# Patient Record
Sex: Male | Born: 1965 | Race: White | Hispanic: Yes | Marital: Married | Smoking: Light tobacco smoker
Health system: Southern US, Community
[De-identification: ages and names within clinical notes are randomized; demographics above are authoritative.]

## PROBLEM LIST (undated history)

## (undated) ENCOUNTER — Emergency Department: Discharge status: Absent without leave | Payer: Managed Care, Other (non HMO)

## (undated) DIAGNOSIS — K219 Gastro-esophageal reflux disease without esophagitis: Secondary | ICD-10-CM

## (undated) DIAGNOSIS — N529 Male erectile dysfunction, unspecified: Secondary | ICD-10-CM

## (undated) DIAGNOSIS — Z9889 Other specified postprocedural states: Secondary | ICD-10-CM

## (undated) HISTORY — PX: BUNIONECTOMY: SHX129

## (undated) HISTORY — PX: COLONOSCOPY: SHX174

## (undated) HISTORY — DX: Other specified postprocedural states: Z98.890

## (undated) HISTORY — DX: Gastro-esophageal reflux disease without esophagitis: K21.9

## (undated) HISTORY — DX: Male erectile dysfunction, unspecified: N52.9

---

## 2003-07-19 ENCOUNTER — Emergency Department (HOSPITAL_COMMUNITY): Admission: AC | Admit: 2003-07-19 | Discharge: 2003-07-19 | Payer: Self-pay

## 2003-08-01 ENCOUNTER — Encounter: Admission: RE | Admit: 2003-08-01 | Discharge: 2003-08-01 | Payer: Self-pay | Admitting: Chiropractic Medicine

## 2005-04-27 DIAGNOSIS — Z9889 Other specified postprocedural states: Secondary | ICD-10-CM

## 2005-04-27 HISTORY — DX: Other specified postprocedural states: Z98.890

## 2007-02-01 ENCOUNTER — Ambulatory Visit: Payer: Self-pay | Admitting: Internal Medicine

## 2007-02-03 ENCOUNTER — Encounter (INDEPENDENT_AMBULATORY_CARE_PROVIDER_SITE_OTHER): Payer: Self-pay | Admitting: *Deleted

## 2007-02-03 LAB — CONVERTED CEMR LAB
Eosinophils Absolute: 0.1 10*3/uL (ref 0.0–0.6)
Eosinophils Relative: 2.2 % (ref 0.0–5.0)
GFR calc Af Amer: 108 mL/min
GFR calc non Af Amer: 89 mL/min
Glucose, Bld: 103 mg/dL — ABNORMAL HIGH (ref 70–99)
HCT: 46.6 % (ref 39.0–52.0)
Lymphocytes Relative: 33.2 % (ref 12.0–46.0)
MCV: 90.3 fL (ref 78.0–100.0)
Neutro Abs: 3.9 10*3/uL (ref 1.4–7.7)
Neutrophils Relative %: 56.2 % (ref 43.0–77.0)
Platelets: 200 10*3/uL (ref 150–400)
Potassium: 3.7 meq/L (ref 3.5–5.1)
Sodium: 140 meq/L (ref 135–145)
Triglycerides: 177 mg/dL — ABNORMAL HIGH (ref 0–149)
WBC: 6.8 10*3/uL (ref 4.5–10.5)

## 2007-08-15 ENCOUNTER — Ambulatory Visit: Payer: Self-pay | Admitting: Internal Medicine

## 2007-08-22 ENCOUNTER — Telehealth: Payer: Self-pay | Admitting: Internal Medicine

## 2007-08-24 ENCOUNTER — Telehealth (INDEPENDENT_AMBULATORY_CARE_PROVIDER_SITE_OTHER): Payer: Self-pay | Admitting: *Deleted

## 2007-08-29 ENCOUNTER — Ambulatory Visit: Payer: Self-pay | Admitting: Internal Medicine

## 2007-09-09 ENCOUNTER — Telehealth (INDEPENDENT_AMBULATORY_CARE_PROVIDER_SITE_OTHER): Payer: Self-pay | Admitting: *Deleted

## 2007-09-09 ENCOUNTER — Ambulatory Visit: Payer: Self-pay | Admitting: Internal Medicine

## 2007-09-12 ENCOUNTER — Encounter (INDEPENDENT_AMBULATORY_CARE_PROVIDER_SITE_OTHER): Payer: Self-pay | Admitting: *Deleted

## 2007-09-27 ENCOUNTER — Encounter: Payer: Self-pay | Admitting: Internal Medicine

## 2007-12-27 HISTORY — PX: INCISION AND DRAINAGE PERIRECTAL ABSCESS: SHX1804

## 2008-01-09 ENCOUNTER — Ambulatory Visit (HOSPITAL_BASED_OUTPATIENT_CLINIC_OR_DEPARTMENT_OTHER): Admission: RE | Admit: 2008-01-09 | Discharge: 2008-01-09 | Payer: Self-pay | Admitting: General Surgery

## 2008-05-11 ENCOUNTER — Ambulatory Visit: Payer: Self-pay | Admitting: Family Medicine

## 2008-05-11 DIAGNOSIS — R42 Dizziness and giddiness: Secondary | ICD-10-CM

## 2009-02-26 ENCOUNTER — Ambulatory Visit: Payer: Self-pay | Admitting: Internal Medicine

## 2009-03-12 ENCOUNTER — Ambulatory Visit: Payer: Self-pay | Admitting: Internal Medicine

## 2009-03-18 LAB — CONVERTED CEMR LAB
ALT: 32 units/L (ref 0–53)
AST: 24 units/L (ref 0–37)
Basophils Relative: 0.2 % (ref 0.0–3.0)
Chloride: 106 meq/L (ref 96–112)
Creatinine, Ser: 1 mg/dL (ref 0.4–1.5)
Eosinophils Absolute: 0.1 10*3/uL (ref 0.0–0.7)
Eosinophils Relative: 2 % (ref 0.0–5.0)
GFR calc non Af Amer: 87.57 mL/min (ref >60)
LDL Cholesterol: 83 mg/dL (ref 0–99)
Lymphocytes Relative: 33.9 % (ref 12.0–46.0)
Neutrophils Relative %: 55.9 % (ref 43.0–77.0)
Potassium: 3.8 meq/L (ref 3.5–5.1)
RBC: 5.13 M/uL (ref 4.22–5.81)
TSH: 0.92 microintl units/mL (ref 0.35–5.50)
VLDL: 32.8 mg/dL (ref 0.0–40.0)
WBC: 6.2 10*3/uL (ref 4.5–10.5)

## 2010-04-14 ENCOUNTER — Ambulatory Visit: Payer: Self-pay | Admitting: Internal Medicine

## 2010-05-29 NOTE — Assessment & Plan Note (Signed)
Summary: HEAD CONGESTION/RH.....   Vital Signs:  Patient profile:   45 year old male Weight:      185.4 pounds BMI:     30.73 Temp:     98.3 degrees F oral Pulse rate:   76 / minute Resp:     15 per minute BP sitting:   114 / 80  (left arm) Cuff size:   large  Vitals Entered By: Shonna Chock CMA (April 14, 2010 4:42 PM) CC: Head congestion x 2 weeks , URI symptoms   CC:  Head congestion x 2 weeks  and URI symptoms.  History of Present Illness:      This is a 45 year old man who presents with RTI  symptoms, onset 2 weeks ago as head congestion.  The patient  now reports purulent nasal discharge and productive cough, but denies nasal congestion and sore throat.  The patient denies fever, dyspnea, and wheezing.  The patient denies headache.  The patient denies the following risk factors for Strep sinusitis: unilateral facial pain, tooth pain, and tender adenopathy.  Rx: Tylenol & Alka Seltzer Cold.  Current Medications (verified): 1)  None  Allergies: 1)  ! Codeine Sulfate (Codeine Sulfate)  Physical Exam  General:  in no acute distress; alert,appropriate and cooperative throughout examination Ears:  External ear exam shows no significant lesions or deformities.  Otoscopic examination reveals clear canals, tympanic membranes are intact bilaterally without bulging, retraction, inflammation or discharge. Hearing is grossly normal bilaterally. Nose:  External nasal examination shows no deformity or inflammation. Nasal mucosa are pink and moist without lesions or exudates. Mouth:  Oral mucosa and oropharynx without lesions or exudates.  Teeth in good repair. Lungs:  Normal respiratory effort, chest expands symmetrically. Lungs are clear to auscultation, no crackles or wheezes. Cervical Nodes:  No lymphadenopathy noted Axillary Nodes:  No palpable lymphadenopathy; very ticklish   Impression & Recommendations:  Problem # 1:  SINUSITIS- ACUTE-NOS (ICD-461.9)  His updated  medication list for this problem includes:    Amoxicillin-pot Clavulanate 875-125 Mg Tabs (Amoxicillin-pot clavulanate) .Marland Kitchen... 1 eveery 12 hrs with a meal    Benzonatate 200 Mg Caps (Benzonatate) .Marland Kitchen... 1 every 6 -8 hrs as needed for cough  Problem # 2:  BRONCHITIS-ACUTE (ICD-466.0)  His updated medication list for this problem includes:    Amoxicillin-pot Clavulanate 875-125 Mg Tabs (Amoxicillin-pot clavulanate) .Marland Kitchen... 1 eveery 12 hrs with a meal    Benzonatate 200 Mg Caps (Benzonatate) .Marland Kitchen... 1 every 6 -8 hrs as needed for cough  Complete Medication List: 1)  Amoxicillin-pot Clavulanate 875-125 Mg Tabs (Amoxicillin-pot clavulanate) .Marland Kitchen.. 1 eveery 12 hrs with a meal 2)  Benzonatate 200 Mg Caps (Benzonatate) .Marland Kitchen.. 1 every 6 -8 hrs as needed for cough  Patient Instructions: 1)  Use a Neti pot once daily - two times a day as needed for head congestion. 2)  Drink as much NON dairy  fluid as you can tolerate for the next few days. Prescriptions: BENZONATATE 200 MG CAPS (BENZONATATE) 1 every 6 -8 hrs as needed for cough  #15 x 0   Entered and Authorized by:   Marga Melnick MD   Signed by:   Marga Melnick MD on 04/14/2010   Method used:   Faxed to ...       [facility] [address] [phone]* (retail)       [address]       [place], [place]  [address]  Ph: [phone]       Fax: [phone]   RxID:   [id number] AMOXICILLIN-POT CLAVULANATE 875-125 MG TABS (AMOXICILLIN-POT CLAVULANATE) 1 eveery 12 hrs WITH a meal  #20 x 0   Entered and Authorized by:   Marga Melnick MD   Signed by:   Marga Melnick MD on 04/14/2010   Method used:   Faxed to ...       [facility] [address] #[id number]* (retail)       [address]       [place], [place]  [address]       Ph: [phone]       Fax: [phone]   RxID:   [id number]    Orders Added: 1)  Est. Patient Level III [31517]

## 2010-06-16 ENCOUNTER — Encounter: Payer: Self-pay | Admitting: Internal Medicine

## 2010-06-16 ENCOUNTER — Ambulatory Visit (INDEPENDENT_AMBULATORY_CARE_PROVIDER_SITE_OTHER): Payer: BC Managed Care – PPO | Admitting: Internal Medicine

## 2010-06-16 DIAGNOSIS — R42 Dizziness and giddiness: Secondary | ICD-10-CM

## 2010-06-24 NOTE — Assessment & Plan Note (Signed)
Summary: dizziness/kn   Vital Signs:  Patient profile:   45 year old male Weight:      185.25 pounds Pulse rate:   79 / minute Pulse rhythm:   regular BP sitting:   118 / 72  (left arm) Cuff size:   large  Vitals Entered By: Army Fossa CMA (June 16, 2010 11:44 AM) CC: Pt here c/o being dizzy when waking up Comments x 3 days  when laying down he feels okay- has to make slow movements.  [facility] [address]  Not fasting    History of Present Illness: dizzines x 3 days associated w/ head turning and standing up fast similar symptoms sporadically in the past but has not have any problems x 2 -3 years  today feels much better  ROS no slurred speach, diplopia no HA no RN, ST, sinus congestion or fever  Current Medications (verified): 1)  Mvi  Allergies (verified): 1)  ! Codeine Sulfate (Codeine Sulfate)  Past History:  Past Medical History: Reviewed history from 02/26/2009 and no changes required. reports a Cscope in 2007 for blood in stools: neg except for int. hemorrhoids h/o hemorrhoids, 09-2007 s/p I&D of a perirectal abscess   Past Surgical History: Reviewed history from 02/26/2009 and no changes required. 09-2007 s/p I&D of a perirectal abscess   Social History: Reviewed history from 02/26/2009 and no changes required. Married 1 child from [place] tobacco-- rarely  ETOH-- frecuently , 1-2 beers   Physical Exam  General:  alert, well-developed, and well-nourished.   Heart:  normal rate, regular rhythm, and no murmur.   Neurologic:  alert & oriented X3, cranial nerves II-XII intact, strength normal in all extremities, gait normal, and DTRs symmetrical and normal.   Psych:  Oriented X3, memory intact for recent and remote, normally interactive, good eye contact, not anxious appearing, and not depressed appearing.     Impression & Recommendations:  Problem # 1:  VERTIGO (ICD-780.4) episode of vertigo, resolving, neuro exam neg. Rec antivert  as needed, will call if symptoms severe or difrent  His updated medication list for this problem includes:    Meclizine Hcl 12.5 Mg Tabs (Meclizine hcl) .Marland Kitchen... 1 every 4 hours as needed for dizzines  Complete Medication List: 1)  Mvi  2)  Meclizine Hcl 12.5 Mg Tabs (Meclizine hcl) .Marland Kitchen.. 1 every 4 hours as needed for dizzines Prescriptions: MECLIZINE HCL 12.5 MG TABS (MECLIZINE HCL) 1 every 4 hours as needed for dizzines  #30 x 0   Entered and Authorized by:   Nolon Rod. Paz MD   Signed by:   Nolon Rod. Paz MD on 06/16/2010   Method used:   Print then Give to Patient   RxID:   [id number]    Orders Added: 1)  Est. Patient Level III [86578]

## 2010-09-09 NOTE — Op Note (Signed)
Drew Estrada, Drew Estrada                ACCOUNT NO.:  [id number]   MEDICAL RECORD NO.:  [id number]          PATIENT TYPE:  AMB   LOCATION:  [facility]                          FACILITY:  [facility]   PHYSICIAN:  Cherylynn Ridges, M.D.    DATE OF BIRTH:  May 05, 1965   DATE OF PROCEDURE:  01/09/2008  DATE OF DISCHARGE:                               OPERATIVE REPORT   PREOPERATIVE DIAGNOSIS:  Perianal fistula.   POSTOPERATIVE DIAGNOSIS:  Perianal fistula.   PROCEDURE:  Exam under anesthesia and fistulotomy.   SURGEON:  Marta Lamas. Lindie Spruce, MD   ANESTHESIA:  General endotracheal.   ESTIMATED BLOOD LOSS:  Less than 20 mL.   COMPLICATIONS:  None.   CONDITION:  Stable.   FINDINGS:  The patient had a fistula tracking from the left perianal  area up to the anterior rectal wall.   INDICATIONS FOR OPERATION:  The patient is a 45 year old with a  persistent draining tract in the perianal area who comes in now for  fistulotomy.   OPERATION:  The patient was taken to the operating room and placed on  table initially in the supine position.  After an adequate general  endotracheal anesthetic was administered, he was placed in lithotomy,  then prepped and draped in the usual sterile manner.   Using a probe and an anal speculum, we able to define the fistulous  tract, which went from the left perianal area to the anterior rectal  mucosal wall at the crypt.  We used 15 blade and then electrocautery to  cut through the tract down to the probe and then we used a curette to  scrape off the scar tissue in the tract.  We obtained hemostasis using  electrocautery, then we packed it with a Gelfoam dibucaine-soaked gauze.  Dressing was applied with 4x4s and ABD.  All counts were correct.      Cherylynn Ridges, M.D.  Electronically Signed     JOW/MEDQ  D:  01/09/2008  T:  01/10/2008  Job:  [id number]

## 2011-01-28 LAB — POCT HEMOGLOBIN-HEMACUE: Hemoglobin: 15.8

## 2011-06-10 ENCOUNTER — Ambulatory Visit (INDEPENDENT_AMBULATORY_CARE_PROVIDER_SITE_OTHER): Payer: BC Managed Care – PPO | Admitting: Internal Medicine

## 2011-06-10 VITALS — BP 122/78 | HR 80 | Temp 98.5°F | Wt 186.0 lb

## 2011-06-10 DIAGNOSIS — F524 Premature ejaculation: Secondary | ICD-10-CM

## 2011-06-10 DIAGNOSIS — R109 Unspecified abdominal pain: Secondary | ICD-10-CM

## 2011-06-10 DIAGNOSIS — R1030 Lower abdominal pain, unspecified: Secondary | ICD-10-CM

## 2011-06-10 NOTE — Patient Instructions (Signed)
Please schedule a physical exam at your convenience in about 3 months

## 2011-06-10 NOTE — Progress Notes (Signed)
  Subjective:    Patient ID: Drew Estrada, male    DOB: 05-Sep-1965, 46 y.o.   MRN: [id number]  HPI Acute visit 4 days ago developed pain left side of the groin and abdomen, the pain somehow radiated to the anterior left leg. It felt like a pulled muscle . Symptoms are now resolved. No mass in the area. Also has noted some problems with early ejaculation. No problems with erectile dysfunction.  Past Medical History: reports a Cscope in 2007 for blood in stools: neg except for int. hemorrhoids h/o hemorrhoids, and perirectal abscess, see surgery   Past Surgical History: 09-2007 s/p I&D of a perirectal abscess   Social History: Married, 1 child from [place] tobacco-- rarely  ETOH-- frecuently , 1-2 beers   Review of Systems No fever, chills. No nausea, vomiting, diarrhea. No blood in the stools. No dysuria, difficulty urinating or gross hematuria.     Objective:   Physical Exam  Constitutional: He appears well-developed and well-nourished.  Abdominal: Soft. He exhibits no distension. There is no tenderness. There is no rebound and no guarding.         No CVA tenderness  Musculoskeletal: He exhibits no edema.       Back no tender to palpation. L leg no tender to palpation. Passive motion of hips, knees wnl.      Assessment & Plan:  Pain at the left groin: Likely muscle skeletal, sx resolved. Recommend observation. Premature ejaculation: He admits to some stress d/t his busy schedule---?> counseled, will call if sx persist

## 2011-06-11 ENCOUNTER — Encounter: Payer: Self-pay | Admitting: Internal Medicine

## 2011-07-15 ENCOUNTER — Ambulatory Visit (INDEPENDENT_AMBULATORY_CARE_PROVIDER_SITE_OTHER): Payer: BC Managed Care – PPO | Admitting: Internal Medicine

## 2011-07-15 DIAGNOSIS — Z0000 Encounter for general adult medical examination without abnormal findings: Secondary | ICD-10-CM | POA: Insufficient documentation

## 2011-07-15 LAB — COMPREHENSIVE METABOLIC PANEL
ALT: 23 U/L (ref 0–53)
AST: 25 U/L (ref 0–37)
Alkaline Phosphatase: 93 U/L (ref 39–117)
CO2: 26 mEq/L (ref 19–32)
GFR: 81.84 mL/min (ref >60.00)
Sodium: 137 mEq/L (ref 135–145)
Total Bilirubin: 0.6 mg/dL (ref 0.3–1.2)
Total Protein: 7.9 g/dL (ref 6.0–8.3)

## 2011-07-15 LAB — CBC WITH DIFFERENTIAL/PLATELET
Basophils Absolute: 0 10*3/uL (ref 0.0–0.1)
Eosinophils Relative: 2.8 % (ref 0.0–5.0)
Lymphocytes Relative: 31.1 % (ref 12.0–46.0)
Monocytes Relative: 6.2 % (ref 3.0–12.0)
Neutrophils Relative %: 59.6 % (ref 43.0–77.0)
Platelets: 167 10*3/uL (ref 150.0–400.0)
RDW: 12.8 % (ref 11.5–14.6)
WBC: 6.7 10*3/uL (ref 4.5–10.5)

## 2011-07-15 LAB — LIPID PANEL
HDL: 45.1 mg/dL (ref >39.00)
LDL Cholesterol: 74 mg/dL (ref 0–99)
Total CHOL/HDL Ratio: 3
VLDL: 33.2 mg/dL (ref 0.0–40.0)

## 2011-07-15 LAB — TSH: TSH: 0.89 u[IU]/mL (ref 0.35–5.50)

## 2011-07-15 NOTE — Progress Notes (Signed)
  Subjective:    Patient ID: Drew Estrada, male    DOB: November 27, 1965, 46 y.o.   MRN: [id number]  HPI CPX  Past Medical History:  reports a Cscope in 2007 for blood in stools: neg except for int. hemorrhoids  h/o hemorrhoids, and perirectal abscess, see surgery   Past Surgical History:  09-2007 s/p I&D of a perirectal abscess   Social History:  Married, 1 child , from [place]  tobacco-- rarely  ETOH-- frecuently , 1-2 beers  Diet-- regular Exercise-- goes to the gym x 3/week Occupation-- Musician business  Family History: Father: deceased MVA MI: mother (at 60y/o) DM: mom Stroke--no colon ca-- no prostate ca--no   Review of Systems  Constitutional: Negative for fever, fatigue and unexpected weight change.  Respiratory: Negative for cough and shortness of breath.   Cardiovascular: Negative for chest pain and leg swelling.  Gastrointestinal: Negative for abdominal pain and blood in stool.  Genitourinary: Negative for dysuria and hematuria.  Psychiatric/Behavioral:       No depression or anxiety        Objective:   Physical Exam  General:  alert and well-developed.   Neck:  no masses, no thyromegaly, and normal carotid upstroke.   Lungs:  normal respiratory effort, no intercostal retractions, no accessory muscle use, and normal breath sounds.   Heart:  normal rate, regular rhythm, and no murmur.   Abdomen:  soft, non-tender, no distention, no masses, no guarding, and no rigidity.   Extremities:   no edema Psych:  Cognition and judgment appear intact. Alert and cooperative with normal attention span and concentration.       Assessment & Plan:

## 2011-07-15 NOTE — Assessment & Plan Note (Signed)
Td 08 reports a Cscope in 2007 for blood in stools: neg except for int. hemorrhoids  Diet-exercise discussed  Labs  EKG NSR

## 2011-07-16 ENCOUNTER — Encounter: Payer: Self-pay | Admitting: Internal Medicine

## 2011-07-17 ENCOUNTER — Encounter: Payer: Self-pay | Admitting: Internal Medicine

## 2012-05-27 ENCOUNTER — Ambulatory Visit (INDEPENDENT_AMBULATORY_CARE_PROVIDER_SITE_OTHER): Payer: BC Managed Care – PPO | Admitting: Internal Medicine

## 2012-05-27 VITALS — BP 114/80 | HR 75 | Temp 98.1°F | Wt 187.0 lb

## 2012-05-27 DIAGNOSIS — B349 Viral infection, unspecified: Secondary | ICD-10-CM

## 2012-05-27 DIAGNOSIS — B9789 Other viral agents as the cause of diseases classified elsewhere: Secondary | ICD-10-CM

## 2012-05-27 NOTE — Progress Notes (Signed)
  Subjective:    Patient ID: Drew Estrada, male    DOB: 06-19-1965, 47 y.o.   MRN: [id number]  HPI Acute visit Not feeling well for the last 5 days: Diet, generalized myalgias, ill-defined tonge pain or irritation.  Past Medical History:   reports a Cscope in 2007 for blood in stools: neg except for int. hemorrhoids   h/o hemorrhoids, and perirectal abscess, see surgery   Past Surgical History:   09-2007 s/p I&D of a perirectal abscess   Social History:   Married, 1 child , from [place]   tobacco-- rarely   ETOH-- frecuently , 1-2 beers   Diet-- regular Exercise-- goes to the gym x 3/week Occupation-- restaurant business   Review of Systems No fever or chills No cough No nausea, vomiting, diarrhea. Appetite remains normal. No nasal discharge. He did have the flu shot in December last year.    Objective:   Physical Exam General -- alert, well-developed, VSS NAD HEENT -- TMs normal, throat w/o redness, face symmetric and not tender to palpation Lungs -- normal respiratory effort, no intercostal retractions, no accessory muscle use, and normal breath sounds.   Heart-- normal rate, regular rhythm, no murmur, and no gallop.   Abdomen--soft, non-tender, no distention, no masses, no HSM, no guarding, and no rigidity.   Neurologic-- alert & oriented X3 and strength normal in all extremities. Psych-- Cognition and judgment appear intact. Alert and cooperative with normal attention span and concentration.  not anxious appearing and not depressed appearing.       Assessment & Plan:  Viral syndrome, Presents with fatigue and myalgias, ill-defined tongue discomfort, likely a viral illness. Plan: Observation for now, see instructions. If he's not improving we may need to a CBC, CKs etc.

## 2012-05-27 NOTE — Patient Instructions (Addendum)
Rest, fluids , tylenol or ibuprofen as needed for aches. Call if no better in 3-4 days Call anytime if the symptoms are severe: Increased pain, fever chills, cough, nausea or diarrhea

## 2012-05-29 ENCOUNTER — Encounter: Payer: Self-pay | Admitting: Internal Medicine

## 2012-07-13 ENCOUNTER — Encounter: Payer: BC Managed Care – PPO | Admitting: Internal Medicine

## 2012-07-19 ENCOUNTER — Encounter: Payer: Self-pay | Admitting: Internal Medicine

## 2012-07-19 ENCOUNTER — Ambulatory Visit (INDEPENDENT_AMBULATORY_CARE_PROVIDER_SITE_OTHER): Payer: BC Managed Care – PPO | Admitting: Internal Medicine

## 2012-07-19 VITALS — BP 128/84 | HR 72 | Temp 98.3°F | Ht 65.5 in | Wt 186.0 lb

## 2012-07-19 DIAGNOSIS — Z0000 Encounter for general adult medical examination without abnormal findings: Secondary | ICD-10-CM

## 2012-07-19 LAB — BASIC METABOLIC PANEL
BUN: 19 mg/dL (ref 6–23)
Chloride: 107 mEq/L (ref 96–112)
GFR: 83.29 mL/min (ref >60.00)
Potassium: 3.9 mEq/L (ref 3.5–5.1)

## 2012-07-19 LAB — ALT: ALT: 30 U/L (ref 0–53)

## 2012-07-19 LAB — AST: AST: 24 U/L (ref 0–37)

## 2012-07-19 LAB — HEMOGLOBIN A1C: Hgb A1c MFr Bld: 5.3 % (ref 4.6–6.5)

## 2012-07-19 LAB — LIPID PANEL
Cholesterol: 157 mg/dL (ref 0–200)
HDL: 41.2 mg/dL (ref >39.00)
VLDL: 34.4 mg/dL (ref 0.0–40.0)

## 2012-07-19 NOTE — Assessment & Plan Note (Signed)
Td 08 reports a Cscope in 2007 for blood in stools: neg except for int. hemorrhoids  Strong FH DM, 3 brothers, ~ age 47 rec to do his best to loose some weight, diet info provided , Labs including A1C

## 2012-07-19 NOTE — Progress Notes (Signed)
  Subjective:    Patient ID: Drew Estrada, male    DOB: 1965/11/29, 47 y.o.   MRN: [id number]  HPI CPX  Past Medical History  Diagnosis Date  . H/O colonoscopy 2007     for blood in stools: neg except for int. hemorrhoids    Past Surgical History  Procedure Laterality Date  . Incision and drainage perirectal abscess  12-2007   History   Social History  . Marital Status: Married    Spouse Name: 1    Number of Children: N/A  . Years of Education: N/A   Occupational History  . restaurant business     Social History Main Topics  . Smoking status: Light Tobacco Smoker    Types: Cigarettes  . Smokeless tobacco: Never Used  . Alcohol Use: Yes     Comment: beer , most days  . Drug Use: No  . Sexually Active: Not on file   Other Topics Concern  . Not on file   Social History Narrative   Married, 1 child , from [place]    Diet-- regular, eats a variety of food groups    Exercise-- goes to the gym x 3/week          Family History  Problem Relation Age of Onset  . Colon cancer Neg Hx   . Prostate cancer Neg Hx   . Diabetes Other     M, B x 3  . CAD Neg Hx   . Stroke Neg Hx    Review of Systems  Respiratory: Negative for cough and shortness of breath.   Cardiovascular: Negative for chest pain and leg swelling.  Gastrointestinal: Negative for nausea, diarrhea and blood in stool.  Genitourinary: Negative for dysuria and hematuria.  Psychiatric/Behavioral:       No anxiety-depression       Objective:   Physical Exam General -- alert, well-developed  Neck --no thyromegaly Lungs -- normal respiratory effort, no intercostal retractions, no accessory muscle use, and normal breath sounds.   Heart-- normal rate, regular rhythm, no murmur, and no gallop.   Abdomen--soft, non-tender, no distention, no masses, no HSM, no guarding, and no rigidity.   Extremities-- no pretibial edema bilaterally  Neurologic-- alert & oriented X3 and strength normal in all  extremities. Psych-- Cognition and judgment appear intact. Alert and cooperative with normal attention span and concentration.  not anxious appearing and not depressed appearing.      Assessment & Plan:

## 2012-07-21 ENCOUNTER — Encounter: Payer: Self-pay | Admitting: *Deleted

## 2013-02-03 ENCOUNTER — Encounter: Payer: Self-pay | Admitting: Family Medicine

## 2013-02-03 ENCOUNTER — Ambulatory Visit (INDEPENDENT_AMBULATORY_CARE_PROVIDER_SITE_OTHER): Payer: Managed Care, Other (non HMO) | Admitting: Family Medicine

## 2013-02-03 VITALS — BP 120/78 | HR 89 | Temp 98.1°F | Wt 187.0 lb

## 2013-02-03 DIAGNOSIS — J029 Acute pharyngitis, unspecified: Secondary | ICD-10-CM

## 2013-02-03 LAB — POCT RAPID STREP A (OFFICE): Rapid Strep A Screen: NEGATIVE

## 2013-02-03 MED ORDER — AMOXICILLIN 875 MG PO TABS: 875.0000 mg | ORAL_TABLET | Freq: Two times a day (BID) | ORAL | Status: DC

## 2013-02-03 NOTE — Patient Instructions (Signed)

## 2013-02-03 NOTE — Progress Notes (Signed)
  Subjective:     Drew Estrada is a 47 y.o. male who presents for evaluation of sore throat. Associated symptoms include post nasal drip, sinus and nasal congestion, sore throat and swollen glands. Onset of symptoms was 7 days ago, and have been gradually worsening since that time. He is drinking plenty of fluids. He has not had a recent close exposure to someone with proven streptococcal pharyngitis.  The following portions of the patient's history were reviewed and updated as appropriate: allergies, current medications, past family history, past medical history, past social history, past surgical history and problem list.  Review of Systems Pertinent items are noted in HPI.    Objective:    BP 120/78  Pulse 89  Temp(Src) 98.1 F (36.7 C) (Oral)  Wt 187 lb (84.823 kg)  BMI 30.63 kg/m2  SpO2 98% General appearance: alert, cooperative, appears stated age and no distress Ears: normal TM's and external ear canals both ears Nose: Nares normal. Septum midline. Mucosa normal. No drainage or sinus tenderness. Throat: abnormal findings: moderate oropharyngeal erythema Neck: moderate anterior cervical adenopathy, supple, symmetrical, trachea midline and thyroid not enlarged, symmetric, no tenderness/mass/nodules Lungs: clear to auscultation bilaterally Heart: S1, S2 normal Skin: Skin color, texture, turgor normal. No rashes or lesions  Laboratory Strep test done. Results neg--culture sent.    Assessment:    Acute pharyngitis, likely  Strep throat.    Plan:    Patient placed on antibiotics. Use of OTC analgesics recommended as well as salt water gargles. Follow up as needed.

## 2013-02-05 LAB — CULTURE, GROUP A STREP: Organism ID, Bacteria: NORMAL

## 2013-03-02 ENCOUNTER — Other Ambulatory Visit: Payer: Self-pay

## 2014-04-11 ENCOUNTER — Encounter: Payer: Self-pay | Admitting: Internal Medicine

## 2014-04-11 ENCOUNTER — Ambulatory Visit (INDEPENDENT_AMBULATORY_CARE_PROVIDER_SITE_OTHER): Payer: Managed Care, Other (non HMO) | Admitting: Internal Medicine

## 2014-04-11 ENCOUNTER — Ambulatory Visit (INDEPENDENT_AMBULATORY_CARE_PROVIDER_SITE_OTHER): Payer: Managed Care, Other (non HMO)

## 2014-04-11 VITALS — BP 118/77 | HR 101 | Temp 98.2°F | Ht 64.0 in | Wt 190.1 lb

## 2014-04-11 DIAGNOSIS — Z23 Encounter for immunization: Secondary | ICD-10-CM

## 2014-04-11 DIAGNOSIS — Z0000 Encounter for general adult medical examination without abnormal findings: Secondary | ICD-10-CM

## 2014-04-11 NOTE — Assessment & Plan Note (Addendum)
Td 08 Flu shot today reports a Cscope in 2007 for blood in stools: neg except for int. hemorrhoids  Strong FH DM, 3 brothers, ~ age 46, A1C last year (-) Exercise-diet info provided , recommend to discontinue OTC diet medications. [employer]fitness[employer]? Labs to Also, complains of trigger finger phenomenon on the right hand on the second third and fourth finger in the mornings, once he warms up he is okay. Exam is negative. Recommend observation.

## 2014-04-11 NOTE — Progress Notes (Signed)
Pre visit review using our clinic review tool, if applicable. No additional management support is needed unless otherwise documented below in the visit note. 

## 2014-04-11 NOTE — Patient Instructions (Signed)
Stop by the front desk and schedule labs to be done within few days (fasting)  Exercise 3 hours a week  Consider use [employer]   Please come back to the office in 1 year  for a physical exam. Come back fasting

## 2014-04-11 NOTE — Progress Notes (Signed)
   Subjective:    Patient ID: Drew Estrada, male    DOB: 25-Feb-1966, 48 y.o.   MRN: [id number]  DOS:  04/11/2014 Type of visit - description : cpx Interval history: In general doing well, has been unable to exercise mostly due to his schedule. Trying to eat healthier, has cut sodas and drinking more water   ROS Denies chest pain or difficulty breathing No nausea, vomiting, diarrhea No cough, sputum production or wheezing No dysuria, gross hematuria difficulty urinating   Past Medical History  Diagnosis Date  . H/O colonoscopy 2007     for blood in stools: neg except for int. hemorrhoids     Past Surgical History  Procedure Laterality Date  . Incision and drainage perirectal abscess  12-2007    History   Social History  . Marital Status: Married    Spouse Name: N/A    Number of Children: 1  . Years of Education: N/A   Occupational History  . restaurant business     Social History Main Topics  . Smoking status: Light Tobacco Smoker    Types: Cigarettes  . Smokeless tobacco: Never Used     Comment: smokes rarely, cigars   . Alcohol Use: 0.0 oz/week    0 Not specified per week     Comment: beer x 2 qd  most days  . Drug Use: No  . Sexual Activity: Not on file   Other Topics Concern  . Not on file   Social History Narrative   Married, 1 step child , from [place]          Family History  Problem Relation Age of Onset  . Colon cancer Neg Hx   . Prostate cancer Neg Hx   . Diabetes Other     M, B x 3  . CAD Neg Hx   . Stroke Neg Hx       Medication List       This list is accurate as of: 04/11/14 11:59 PM.  Always use your most recent med list.               FIBER PO  Take 1 tablet by mouth daily.     multivitamin Tabs tablet  Take 1 tablet by mouth daily.     omeprazole 20 MG tablet  Commonly known as:  PRILOSEC OTC  Take 20 mg by mouth daily.     OVER THE COUNTER MEDICATION  Hydroxycut: OTC Diet Pill: 1 tablet daily.             Objective:   Physical Exam BP 118/77 mmHg  Pulse 101  Temp(Src) 98.2 F (36.8 C) (Oral)  Ht 5\' 4"  (1.626 m)  Wt 190 lb 2 oz (86.24 kg)  BMI 32.62 kg/m2  SpO2 96% General -- alert, well-developed, NAD.  Neck --no thyromegaly  HEENT-- Not pale.  Lungs -- normal respiratory effort, no intercostal retractions, no accessory muscle use, and normal breath sounds.  Heart-- normal rate, regular rhythm, no murmur.  Abdomen-- Not distended, good bowel sounds,soft, non-tender.  Extremities-- no pretibial edema bilaterally  Neurologic--  alert & oriented X3. Speech normal, gait appropriate for age, strength symmetric and appropriate for age.  Psych-- Cognition and judgment appear intact. Cooperative with normal attention span and concentration. No anxious or depressed appearing.        Assessment & Plan:

## 2014-04-12 ENCOUNTER — Telehealth: Payer: Self-pay | Admitting: Internal Medicine

## 2014-04-12 ENCOUNTER — Other Ambulatory Visit (INDEPENDENT_AMBULATORY_CARE_PROVIDER_SITE_OTHER): Payer: Managed Care, Other (non HMO)

## 2014-04-12 DIAGNOSIS — Z0000 Encounter for general adult medical examination without abnormal findings: Secondary | ICD-10-CM

## 2014-04-12 LAB — COMPREHENSIVE METABOLIC PANEL
ALT: 32 U/L (ref 0–53)
AST: 28 U/L (ref 0–37)
Albumin: 4 g/dL (ref 3.5–5.2)
Alkaline Phosphatase: 98 U/L (ref 39–117)
BILIRUBIN TOTAL: 0.6 mg/dL (ref 0.2–1.2)
BUN: 20 mg/dL (ref 6–23)
CO2: 23 meq/L (ref 19–32)
CREATININE: 1 mg/dL (ref 0.4–1.5)
Calcium: 9.2 mg/dL (ref 8.4–10.5)
Chloride: 107 mEq/L (ref 96–112)
GFR: 85.51 mL/min (ref >60.00)
Glucose, Bld: 99 mg/dL (ref 70–99)
Potassium: 4 mEq/L (ref 3.5–5.1)
Sodium: 138 mEq/L (ref 135–145)
Total Protein: 7.3 g/dL (ref 6.0–8.3)

## 2014-04-12 LAB — CBC WITH DIFFERENTIAL/PLATELET
BASOS PCT: 0.4 % (ref 0.0–3.0)
Basophils Absolute: 0 10*3/uL (ref 0.0–0.1)
EOS PCT: 2.3 % (ref 0.0–5.0)
Eosinophils Absolute: 0.2 10*3/uL (ref 0.0–0.7)
HEMATOCRIT: 46.5 % (ref 39.0–52.0)
HEMOGLOBIN: 15.7 g/dL (ref 13.0–17.0)
LYMPHS ABS: 1.9 10*3/uL (ref 0.7–4.0)
Lymphocytes Relative: 29.9 % (ref 12.0–46.0)
MCHC: 33.8 g/dL (ref 30.0–36.0)
MCV: 88.9 fl (ref 78.0–100.0)
MONOS PCT: 9.9 % (ref 3.0–12.0)
Monocytes Absolute: 0.6 10*3/uL (ref 0.1–1.0)
NEUTROS ABS: 3.7 10*3/uL (ref 1.4–7.7)
Neutrophils Relative %: 57.5 % (ref 43.0–77.0)
Platelets: 179 10*3/uL (ref 150.0–400.0)
RBC: 5.23 Mil/uL (ref 4.22–5.81)
RDW: 12.4 % (ref 11.5–15.5)
WBC: 6.5 10*3/uL (ref 4.0–10.5)

## 2014-04-12 LAB — LIPID PANEL
CHOL/HDL RATIO: 4
Cholesterol: 145 mg/dL (ref 0–200)
HDL: 33.2 mg/dL — AB (ref >39.00)
LDL Cholesterol: 74 mg/dL (ref 0–99)
NONHDL: 111.8
Triglycerides: 189 mg/dL — ABNORMAL HIGH (ref 0.0–149.0)
VLDL: 37.8 mg/dL (ref 0.0–40.0)

## 2014-04-12 LAB — TSH: TSH: 1.19 u[IU]/mL (ref 0.35–4.50)

## 2014-04-12 NOTE — Telephone Encounter (Signed)
emmi emailed °

## 2014-07-27 ENCOUNTER — Ambulatory Visit (INDEPENDENT_AMBULATORY_CARE_PROVIDER_SITE_OTHER): Payer: Managed Care, Other (non HMO) | Admitting: Internal Medicine

## 2014-07-27 ENCOUNTER — Encounter: Payer: Self-pay | Admitting: Internal Medicine

## 2014-07-27 ENCOUNTER — Telehealth: Payer: Self-pay | Admitting: Internal Medicine

## 2014-07-27 VITALS — BP 124/68 | HR 79 | Temp 98.1°F | Ht 64.0 in | Wt 185.5 lb

## 2014-07-27 DIAGNOSIS — N529 Male erectile dysfunction, unspecified: Secondary | ICD-10-CM | POA: Diagnosis not present

## 2014-07-27 MED ORDER — SILDENAFIL CITRATE 100 MG PO TABS
50.0000 mg | ORAL_TABLET | Freq: Every day | ORAL | Status: DC | PRN
Start: 2014-07-27 — End: 2016-05-05

## 2014-07-27 NOTE — Progress Notes (Signed)
   Subjective:    Patient ID: Drew Estrada, male    DOB: May 07, 1965, 49 y.o.   MRN: [id number]  DOS:  07/27/2014 Type of visit - description : acute Interval history: Chief complaint today is difficulty with erections, problem started 2 months ago, he is able to get an erection but it does last enough   Review of Systems Admit to some stress, very busy at work but no anxiety per se except for the fact that he is unable to get  normal erections. + Performance anxiety. No dysuria, gross hematuria difficulty urinating. No decrease in libido  Past Medical History  Diagnosis Date  . H/O colonoscopy 2007     for blood in stools: neg except for int. hemorrhoids     Past Surgical History  Procedure Laterality Date  . Incision and drainage perirectal abscess  12-2007    History   Social History  . Marital Status: Married    Spouse Name: N/A  . Number of Children: 1  . Years of Education: N/A   Occupational History  . restaurant business     Social History Main Topics  . Smoking status: Light Tobacco Smoker    Types: Cigarettes  . Smokeless tobacco: Never Used     Comment: smokes rarely, cigars   . Alcohol Use: 0.0 oz/week    0 Standard drinks or equivalent per week     Comment: beer x 2 qd  most days  . Drug Use: No  . Sexual Activity: Not on file   Other Topics Concern  . Not on file   Social History Narrative   Married, 1 step child , from [place]             Medication List       This list is accurate as of: 07/27/14 11:59 PM.  Always use your most recent med list.               FIBER PO  Take 1 tablet by mouth daily.     multivitamin Tabs tablet  Take 1 tablet by mouth daily.     omeprazole 20 MG tablet  Commonly known as:  PRILOSEC OTC  Take 20 mg by mouth daily.     OVER THE COUNTER MEDICATION  Hydroxycut: OTC Diet Pill: 1 tablet daily.     sildenafil 100 MG tablet  Commonly known as:  VIAGRA  Take 0.5-1 tablets (50-100 mg total) by mouth  daily as needed for erectile dysfunction.           Objective:   Physical Exam BP 124/68 mmHg  Pulse 79  Temp(Src) 98.1 F (36.7 C) (Oral)  Ht 5\' 4"  (1.626 m)  Wt 185 lb 8 oz (84.142 kg)  BMI 31.83 kg/m2  SpO2 97% General:   Well developed, well nourished . NAD.  HEENT:  Normocephalic . Face symmetric, atraumatic Neurologic:  alert & oriented X3.  Speech normal, gait appropriate for age and unassisted Psych--  Cognition and judgment appear intact.  Cooperative with normal attention span and concentration.  Behavior appropriate. No anxious or depressed appearing.        Assessment & Plan:

## 2014-07-27 NOTE — Telephone Encounter (Signed)
emmi emailed °

## 2014-07-27 NOTE — Patient Instructions (Signed)
Try Viagra as needed Call if side effects or problems

## 2014-07-27 NOTE — Progress Notes (Signed)
Pre visit review using our clinic review tool, if applicable. No additional management support is needed unless otherwise documented below in the visit note. 

## 2014-07-29 DIAGNOSIS — N529 Male erectile dysfunction, unspecified: Secondary | ICD-10-CM | POA: Insufficient documentation

## 2014-07-29 NOTE — Assessment & Plan Note (Signed)
Healthy gentleman with 2 months history of difficulty with erections, most likely related to stress. Patient is counseled, recommend a trial with Viagra at least temporarily, how to take it and side effects discussed. He is in agreement.

## 2014-10-05 ENCOUNTER — Encounter: Payer: Self-pay | Admitting: Internal Medicine

## 2014-10-05 ENCOUNTER — Ambulatory Visit (INDEPENDENT_AMBULATORY_CARE_PROVIDER_SITE_OTHER): Payer: Managed Care, Other (non HMO) | Admitting: Internal Medicine

## 2014-10-05 VITALS — BP 122/68 | HR 75 | Temp 98.5°F | Ht 64.0 in | Wt 188.5 lb

## 2014-10-05 DIAGNOSIS — M25512 Pain in left shoulder: Secondary | ICD-10-CM | POA: Diagnosis not present

## 2014-10-05 MED ORDER — PREDNISONE 10 MG PO TABS: ORAL_TABLET | ORAL | Status: DC

## 2014-10-05 NOTE — Progress Notes (Signed)
Subjective:    Patient ID: Drew Estrada, male    DOB: 1966/01/22, 49 y.o.   MRN: [id number]  DOS:  10/05/2014 Type of visit - description : Acute Interval history: Symptoms started a month ago with mild pain on the left shoulder, symptoms increase 2 days ago, pain located at the left deltoid area, some and the trapezoid and even mid left back. Worse with lifting weights in the gym, he is a Doctor, general practice, he also do some heavy lifting at work and that bothers him.   Review of Systems  No pain at the cervical spine per se, no bladder or bowel incontinence, no paresthesias, gait normal.   Past Medical History  Diagnosis Date  . H/O colonoscopy 2007     for blood in stools: neg except for int. hemorrhoids     Past Surgical History  Procedure Laterality Date  . Incision and drainage perirectal abscess  12-2007    History   Social History  . Marital Status: Married    Spouse Name: N/A  . Number of Children: 1  . Years of Education: N/A   Occupational History  . restaurant business     Social History Main Topics  . Smoking status: Light Tobacco Smoker    Types: Cigarettes  . Smokeless tobacco: Never Used     Comment: smokes rarely, cigars   . Alcohol Use: 0.0 oz/week    0 Standard drinks or equivalent per week     Comment: beer x 2 qd  most days  . Drug Use: No  . Sexual Activity: Not on file   Other Topics Concern  . Not on file   Social History Narrative   Married, 1 step child , from [place]             Medication List       This list is accurate as of: 10/05/14 11:59 PM.  Always use your most recent med list.               FIBER PO  Take 1 tablet by mouth daily.     multivitamin Tabs tablet  Take 1 tablet by mouth daily.     omeprazole 20 MG tablet  Commonly known as:  PRILOSEC OTC  Take 20 mg by mouth daily.     OVER THE COUNTER MEDICATION  Hydroxycut: OTC Diet Pill: 1 tablet daily.     predniSONE 10 MG tablet  Commonly known as:  DELTASONE   4 tablets x 2 days, 3 tabs x 2 days, 2 tabs x 2 days, 1 tab x 2 days     sildenafil 100 MG tablet  Commonly known as:  VIAGRA  Take 0.5-1 tablets (50-100 mg total) by mouth daily as needed for erectile dysfunction.           Objective:   Physical Exam  Skin:      BP 122/68 mmHg  Pulse 75  Temp(Src) 98.5 F (36.9 C) (Oral)  Ht 5\' 4"  (1.626 m)  Wt 188 lb 8 oz (85.503 kg)  BMI 32.34 kg/m2  SpO2 96%  General:   Well developed, well nourished . NAD.  HEENT:  Normocephalic . Face symmetric, atraumatic Neck: Range of motion normal, no TTP at the cervical spine. Neurologic:  alert & oriented X3.  Speech normal, gait appropriate for age and unassisted DTRs symmetric MSK: Right shoulder normal, left shoulder with mild pain with range of motion Psych--  Cognition and judgment appear intact.  Cooperative with  normal attention span and concentration.  Behavior appropriate. No anxious or depressed appearing.       Assessment & Plan:    Pain, left shoulder area, Symptoms likely related with the shoulder itself, radiculopathy is possible but less likely. Recommend stretching, 3 different exercises were showed to the patient. Prednisone Tylenol Call if not improving.

## 2014-10-05 NOTE — Progress Notes (Signed)
Pre visit review using our clinic review tool, if applicable. No additional management support is needed unless otherwise documented below in the visit note. 

## 2014-10-05 NOTE — Patient Instructions (Signed)
Prednisone as prescribed   Stretching 2 or 3 times a day  Tylenol  500 mg OTC 2 tabs a day every 8 hours as needed for pain  Call if no better or if not back to normal in 1 month

## 2014-10-16 ENCOUNTER — Telehealth: Payer: Self-pay | Admitting: Internal Medicine

## 2014-10-16 DIAGNOSIS — M25512 Pain in left shoulder: Secondary | ICD-10-CM

## 2014-10-16 NOTE — Telephone Encounter (Signed)
Referral placed.

## 2014-10-16 NOTE — Telephone Encounter (Signed)
Caller name: Rayman Petrosian Relationship to patient: wife Can be reached: [phone]  Reason for call: Pt wife called in. The Prednisone did not help shoulder pain. Per wife Dr. Larose Kells would refer to sports medicine if not improved. Please enter referral

## 2014-10-16 NOTE — Telephone Encounter (Signed)
Please advise 

## 2014-10-16 NOTE — Telephone Encounter (Signed)
Please enter a sports medicine referral, DX shoulder pain

## 2014-10-24 ENCOUNTER — Encounter: Payer: Self-pay | Admitting: Family Medicine

## 2014-10-24 ENCOUNTER — Ambulatory Visit (INDEPENDENT_AMBULATORY_CARE_PROVIDER_SITE_OTHER): Payer: Managed Care, Other (non HMO) | Admitting: Family Medicine

## 2014-10-24 VITALS — BP 127/78 | HR 69 | Ht 65.0 in | Wt 180.0 lb

## 2014-10-24 DIAGNOSIS — M25512 Pain in left shoulder: Secondary | ICD-10-CM

## 2014-10-24 NOTE — Patient Instructions (Signed)
You have rotator cuff impingement Try to avoid painful activities (overhead activities, lifting with extended arm) as much as possible. Aleve 2 tabs twice a day with food OR ibuprofen 3 tabs three times a day with food for pain and inflammation for 7-10 days then as needed. Can take tylenol in addition to this. Subacromial injection may be beneficial to help with pain and to decrease inflammation. Consider physical therapy with transition to home exercise program. Do home exercise program with theraband and scapular stabilization exercises daily - these are very important for long term relief even if an injection was given.  3 sets of 10 once a day for next 6 weeks. If not improving at follow-up we will consider further imaging, injection, physical therapy, and/or nitro patches. Follow up with me in 5-6 weeks.

## 2014-10-25 NOTE — Progress Notes (Signed)
PCP and referred by: Kathlene November, MD  Subjective:   HPI: Patient is a 49 y.o. male here for left shoulder pain.  Patient reports he's had about 1 month of left shoulder pain. Recalls doing lateral side raises at gym but no acute injury. 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 Is right handed. Was waking him at night but has improved some. Tried prednisone dose pack. No prior issues with this shoulder.  Past Medical History  Diagnosis Date  . H/O colonoscopy 2007     for blood in stools: neg except for int. hemorrhoids     Current Outpatient Prescriptions on File Prior to Visit  Medication Sig Dispense Refill  . FIBER PO Take 1 tablet by mouth daily.    . multivitamin (ONE-A-DAY MEN'S) TABS tablet Take 1 tablet by mouth daily.    Marland Kitchen omeprazole (PRILOSEC OTC) 20 MG tablet Take 20 mg by mouth daily.    Marland Kitchen OVER THE COUNTER MEDICATION Hydroxycut: OTC Diet Pill: 1 tablet daily.    . predniSONE (DELTASONE) 10 MG tablet 4 tablets x 2 days, 3 tabs x 2 days, 2 tabs x 2 days, 1 tab x 2 days 20 tablet 0  . sildenafil (VIAGRA) 100 MG tablet Take 0.5-1 tablets (50-100 mg total) by mouth daily as needed for erectile dysfunction. 3 tablet 6   No current facility-administered medications on file prior to visit.    Past Surgical History  Procedure Laterality Date  . Incision and drainage perirectal abscess  12-2007    Allergies  Allergen Reactions  . Codeine Sulfate     REACTION: nausea    History   Social History  . Marital Status: Married    Spouse Name: N/A  . Number of Children: 1  . Years of Education: N/A   Occupational History  . restaurant business     Social History Main Topics  . Smoking status: Light Tobacco Smoker    Types: Cigarettes  . Smokeless tobacco: Never Used     Comment: smokes rarely, cigars   . Alcohol Use: 0.0 oz/week    0 Standard drinks or equivalent per week     Comment: beer x 2 qd  most days  . Drug Use: No  . Sexual Activity:  Not on file   Other Topics Concern  . Not on file   Social History Narrative   Married, 1 step child , from [place]         Family History  Problem Relation Age of Onset  . Colon cancer Neg Hx   . Prostate cancer Neg Hx   . Diabetes Other     M, B x 3  . CAD Neg Hx   . Stroke Neg Hx     BP 127/78 mmHg  Pulse 69  Ht 5\' 5"  (1.651 m)  Wt 180 lb (81.647 kg)  BMI 29.95 kg/m2  Review of Systems: See HPI above.    Objective:  Physical Exam:  Gen: NAD  Left shoulder: No swelling, ecchymoses.  No gross deformity. No TTP. FROM with mild painful arc. Mild positive Hawkins, Neers. Negative Speeds, Yergasons. Strength 5/5 with empty can and resisted internal/external rotation. Negative apprehension. NV intact distally.    Assessment & Plan:  1. Left shoulder pain - 2/2 rotator cuff impingement.  Shown home exercises to do daily, discussed nsaids.  Consider injection, PT, nitro patches if not improving.  F/u in 5-6 weeks.

## 2014-10-25 NOTE — Assessment & Plan Note (Signed)
2/2 rotator cuff impingement.  Shown home exercises to do daily, discussed nsaids.  Consider injection, PT, nitro patches if not improving.  F/u in 5-6 weeks.

## 2014-11-29 ENCOUNTER — Ambulatory Visit: Payer: Managed Care, Other (non HMO) | Admitting: Family Medicine

## 2014-12-05 ENCOUNTER — Encounter: Payer: Self-pay | Admitting: Family Medicine

## 2014-12-05 ENCOUNTER — Ambulatory Visit (INDEPENDENT_AMBULATORY_CARE_PROVIDER_SITE_OTHER): Payer: Managed Care, Other (non HMO) | Admitting: Family Medicine

## 2014-12-05 VITALS — BP 111/74 | HR 72 | Ht 65.0 in | Wt 182.0 lb

## 2014-12-05 DIAGNOSIS — M25512 Pain in left shoulder: Secondary | ICD-10-CM

## 2014-12-05 NOTE — Assessment & Plan Note (Signed)
2/2 rotator cuff impingement.  Much improved.  Continue HEP most days of the week for 6 more weeks.  Call us with any problems.  F/u prn otherwise.

## 2014-12-05 NOTE — Progress Notes (Signed)
PCP and referred by: Kathlene November, MD  Subjective:   HPI: Patient is a 49 y.o. male here for left shoulder pain.  6/29: Patient reports he's had about 1 month of left shoulder pain. Recalls doing lateral side raises at gym but no acute injury. 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 Is right handed. Was waking him at night but has improved some. Tried prednisone dose pack. No prior issues with this shoulder.  8/10: Patient reports he feels improved from last visit. Mild pain at times but nothing to reproduce this. Working out in gym and doing well at work without pain now. Doing home exercises.  Past Medical History  Diagnosis Date  . H/O colonoscopy 2007     for blood in stools: neg except for int. hemorrhoids     Current Outpatient Prescriptions on File Prior to Visit  Medication Sig Dispense Refill  . FIBER PO Take 1 tablet by mouth daily.    . multivitamin (ONE-A-DAY MEN'S) TABS tablet Take 1 tablet by mouth daily.    Marland Kitchen omeprazole (PRILOSEC OTC) 20 MG tablet Take 20 mg by mouth daily.    Marland Kitchen OVER THE COUNTER MEDICATION Hydroxycut: OTC Diet Pill: 1 tablet daily.    . predniSONE (DELTASONE) 10 MG tablet 4 tablets x 2 days, 3 tabs x 2 days, 2 tabs x 2 days, 1 tab x 2 days 20 tablet 0  . sildenafil (VIAGRA) 100 MG tablet Take 0.5-1 tablets (50-100 mg total) by mouth daily as needed for erectile dysfunction. 3 tablet 6   No current facility-administered medications on file prior to visit.    Past Surgical History  Procedure Laterality Date  . Incision and drainage perirectal abscess  12-2007    Allergies  Allergen Reactions  . Codeine Sulfate     REACTION: nausea    Social History   Social History  . Marital Status: Married    Spouse Name: N/A  . Number of Children: 1  . Years of Education: N/A   Occupational History  . restaurant business     Social History Main Topics  . Smoking status: Light Tobacco Smoker    Types: Cigarettes  .  Smokeless tobacco: Never Used     Comment: smokes rarely, cigars   . Alcohol Use: 0.0 oz/week    0 Standard drinks or equivalent per week     Comment: beer x 2 qd  most days  . Drug Use: No  . Sexual Activity: Not on file   Other Topics Concern  . Not on file   Social History Narrative   Married, 1 step child , from [place]         Family History  Problem Relation Age of Onset  . Colon cancer Neg Hx   . Prostate cancer Neg Hx   . Diabetes Other     M, B x 3  . CAD Neg Hx   . Stroke Neg Hx     BP 111/74 mmHg  Pulse 72  Ht 5\' 5"  (1.651 m)  Wt 182 lb (82.555 kg)  BMI 30.29 kg/m2  Review of Systems: See HPI above.    Objective:  Physical Exam:  Gen: NAD  Left shoulder: No swelling, ecchymoses.  No gross deformity. No TTP. FROM with negative painful arc. Negative Hawkins, Neers. Negative Speeds, Yergasons. Strength 5/5 with empty can and resisted internal/external rotation. Negative apprehension. NV intact distally.    Assessment & Plan:  1. Left shoulder  pain - 2/2 rotator cuff impingement.  Much improved.  Continue HEP most days of the week for 6 more weeks.  Call us with any problems.  F/u prn otherwise.

## 2015-04-15 ENCOUNTER — Telehealth: Payer: Self-pay

## 2015-04-15 NOTE — Telephone Encounter (Signed)
Pre- Visit call Completed 

## 2015-04-16 ENCOUNTER — Encounter: Payer: Self-pay | Admitting: Internal Medicine

## 2015-04-16 ENCOUNTER — Ambulatory Visit (INDEPENDENT_AMBULATORY_CARE_PROVIDER_SITE_OTHER): Payer: Managed Care, Other (non HMO) | Admitting: Internal Medicine

## 2015-04-16 VITALS — BP 122/64 | HR 69 | Temp 97.9°F | Ht 65.0 in | Wt 193.2 lb

## 2015-04-16 DIAGNOSIS — Z114 Encounter for screening for human immunodeficiency virus [HIV]: Secondary | ICD-10-CM

## 2015-04-16 DIAGNOSIS — Z23 Encounter for immunization: Secondary | ICD-10-CM | POA: Diagnosis not present

## 2015-04-16 DIAGNOSIS — Z0000 Encounter for general adult medical examination without abnormal findings: Secondary | ICD-10-CM

## 2015-04-16 DIAGNOSIS — Z09 Encounter for follow-up examination after completed treatment for conditions other than malignant neoplasm: Secondary | ICD-10-CM

## 2015-04-16 LAB — BASIC METABOLIC PANEL
BUN: 15 mg/dL (ref 6–23)
CHLORIDE: 105 meq/L (ref 96–112)
CO2: 30 meq/L (ref 19–32)
CREATININE: 1.08 mg/dL (ref 0.40–1.50)
Calcium: 9 mg/dL (ref 8.4–10.5)
GFR: 77.9 mL/min (ref >60.00)
Glucose, Bld: 100 mg/dL — ABNORMAL HIGH (ref 70–99)
Potassium: 3.8 mEq/L (ref 3.5–5.1)
Sodium: 140 mEq/L (ref 135–145)

## 2015-04-16 LAB — HIV ANTIBODY (ROUTINE TESTING W REFLEX): HIV 1&2 Ab, 4th Generation: NONREACTIVE

## 2015-04-16 LAB — LIPID PANEL
CHOL/HDL RATIO: 4
Cholesterol: 130 mg/dL (ref 0–200)
HDL: 37 mg/dL — AB (ref >39.00)
LDL CALC: 66 mg/dL (ref 0–99)
NonHDL: 92.56
TRIGLYCERIDES: 135 mg/dL (ref 0.0–149.0)
VLDL: 27 mg/dL (ref 0.0–40.0)

## 2015-04-16 NOTE — Progress Notes (Signed)
Subjective:    Patient ID: Drew Estrada, male    DOB: 10-29-1965, 49 y.o.   MRN: [id number]  DOS:  04/16/2015 Type of visit - description : CPX  Interval history: No major concerns   Review of Systems  Constitutional: No fever. No chills. No unexplained wt changes. No unusual sweats  HEENT: No dental problems, no ear discharge, no facial swelling, no voice changes. No eye discharge, no eye  redness , no  intolerance to light   Respiratory: No wheezing , no  difficulty breathing. No cough , no mucus production  Cardiovascular: Occasional sharp chest pain, last 5-10 seconds, sharp , left-sided, since childhood, associated with stress?., no leg swelling , no  Palpitations  GI: no nausea, no vomiting, no diarrhea , no  abdominal pain.  Long history of occasional blood in the stools with BMs, small amount, thought to be from hemorrhoids No dysphagia, no odynophagia    Endocrine: No polyphagia, no polyuria , no polydipsia  GU: No dysuria, gross hematuria, difficulty urinating. No urinary urgency, no frequency.  Musculoskeletal: No joint swellings or unusual aches or pains  Skin: No change in the color of the skin, palor , no  Rash  Allergic, immunologic: No environmental allergies , no  food allergies  Neurological: No dizziness no  syncope. No headaches. No diplopia, no slurred, no slurred speech, no motor deficits, no facial  Numbness  Hematological: No enlarged lymph nodes, no easy bruising , no unusual bleedings  Psychiatry: No suicidal ideas, no hallucinations, no beavior problems, no confusion.  No unusual/severe anxiety, no depression  Past Medical History  Diagnosis Date  . H/O colonoscopy 2007     for blood in stools: neg except for int. hemorrhoids     Past Surgical History  Procedure Laterality Date  . Incision and drainage perirectal abscess  12-2007  . Bunionectomy Right ~2011    has a screw    Social History   Social History  . Marital Status:  Married    Spouse Name: N/A  . Number of Children: 1  . Years of Education: N/A   Occupational History  . restaurant business     Social History Main Topics  . Smoking status: Light Tobacco Smoker    Types: Cigarettes  . Smokeless tobacco: Never Used     Comment: smokes rarely, cigars   . Alcohol Use: 0.0 oz/week    0 Standard drinks or equivalent per week     Comment: beer x 2 qd  most days  . Drug Use: No  . Sexual Activity: Not on file   Other Topics Concern  . Not on file   Social History Narrative   Married, 1 step child , from [place]          Family History  Problem Relation Age of Onset  . Colon cancer Neg Hx   . Prostate cancer Neg Hx   . Diabetes Other     M, B x 3  . CAD Neg Hx   . Stroke Neg Hx        Medication List       This list is accurate as of: 04/16/15  5:27 PM.  Always use your most recent med list.               FIBER PO  Take 1 tablet by mouth daily.     multivitamin Tabs tablet  Take 1 tablet by mouth daily.     omeprazole 20  MG tablet  Commonly known as:  PRILOSEC OTC  Take 20 mg by mouth daily.     OVER THE COUNTER MEDICATION  Hydroxycut: OTC Diet Pill: 1 tablet daily.     sildenafil 100 MG tablet  Commonly known as:  VIAGRA  Take 0.5-1 tablets (50-100 mg total) by mouth daily as needed for erectile dysfunction.           Objective:   Physical Exam BP 122/64 mmHg  Pulse 69  Temp(Src) 97.9 F (36.6 C) (Oral)  Ht 5\' 5"  (1.651 m)  Wt 193 lb 4 oz (87.658 kg)  BMI 32.16 kg/m2  SpO2 97%  General:   Well developed, well nourished . NAD.  Neck: No  Thyromegaly HEENT:  Normocephalic . Face symmetric, atraumatic Lungs:  CTA B Normal respiratory effort, no intercostal retractions, no accessory muscle use. Heart: RRR,  no murmur.  No pretibial edema bilaterally  Abdomen:  Not distended, soft, non-tender. No rebound or rigidity.   Skin: Exposed areas without rash. Not pale. Not jaundice Neurologic:  alert &  oriented X3.  Speech normal, gait appropriate for age and unassisted Strength symmetric and appropriate for age.  Psych: Cognition and judgment appear intact.  Cooperative with normal attention span and concentration.  Behavior appropriate. No anxious or depressed appearing.    Assessment & Plan:   Assessment  GERD Hemorrhoids ED (viagra prn)  PLAN GERD: Symptoms well controlled with PPIs as needed. Chest pain as described above, recommend observation RTC one year

## 2015-04-16 NOTE — Progress Notes (Signed)
Pre visit review using our clinic review tool, if applicable. No additional management support is needed unless otherwise documented below in the visit note. 

## 2015-04-16 NOTE — Patient Instructions (Signed)
Before you leave the office:  Get your blood work at the lab  Go to the front desk:  --Schedule a complete physical exam to be done in 1 year Please be fasting  We can also see you in the afternoon and check labs the next day

## 2015-04-16 NOTE — Assessment & Plan Note (Signed)
GERD: Symptoms well controlled with PPIs as needed. Chest pain as described above, recommend observation RTC one year

## 2015-04-16 NOTE — Assessment & Plan Note (Addendum)
Td 08 Flu shot today reports a Cscope in 2007 for blood in stools: neg except for int. hemorrhoids  Strong FH DM, 3 brothers, diet and exercise discussed. Labs: HIV, FLP, BMP

## 2015-09-18 ENCOUNTER — Ambulatory Visit (HOSPITAL_BASED_OUTPATIENT_CLINIC_OR_DEPARTMENT_OTHER)
Admission: RE | Admit: 2015-09-18 | Discharge: 2015-09-18 | Disposition: A | Discharge status: Home / Self Care | Payer: Managed Care, Other (non HMO) | Source: Ambulatory Visit | Attending: Medical | Admitting: Medical

## 2015-09-18 ENCOUNTER — Ambulatory Visit (INDEPENDENT_AMBULATORY_CARE_PROVIDER_SITE_OTHER): Payer: Managed Care, Other (non HMO) | Admitting: Medical

## 2015-09-18 ENCOUNTER — Encounter: Payer: Self-pay | Admitting: Medical

## 2015-09-18 VITALS — BP 124/78 | HR 68 | Temp 98.0°F | Ht 65.0 in | Wt 192.0 lb

## 2015-09-18 DIAGNOSIS — M79641 Pain in right hand: Secondary | ICD-10-CM

## 2015-09-18 DIAGNOSIS — M79644 Pain in right finger(s): Secondary | ICD-10-CM | POA: Diagnosis not present

## 2015-09-18 MED ORDER — DICLOFENAC SODIUM 75 MG PO TBEC: 75.0000 mg | DELAYED_RELEASE_TABLET | Freq: Two times a day (BID) | ORAL | Status: DC

## 2015-09-18 NOTE — Progress Notes (Addendum)
Subjective:    Patient ID: Drew Estrada, male    DOB: [birth year], 50 y.o.   MRN: [id number]  HPI  Pt in with report of rt thumb pain. He reports pain on dorsal and ventral side for 1 month. 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 No injury to area that he knows of. Pt is rt handed. 2 days ago he notes he could not full extend his rt thumb.  Pt boxes for recreation but no pain when he was boxing last week.  Also pt reports that 3rd, 4th, and 5th digit on rt hand occasional feels tight and contracted in morning.    Review of Systems  Musculoskeletal:       Rt hand pain. Finger and thumb symptoms hpi.    Past Medical History  Diagnosis Date  . H/O colonoscopy 2007     for blood in stools: neg except for int. hemorrhoids      Social History   Social History  . Marital Status: Married    Spouse Name: N/A  . Number of Children: 1  . Years of Education: N/A   Occupational History  . restaurant business     Social History Main Topics  . Smoking status: Light Tobacco Smoker    Types: Cigarettes  . Smokeless tobacco: Never Used     Comment: smokes rarely, cigars   . Alcohol Use: 0.0 oz/week    0 Standard drinks or equivalent per week     Comment: beer x 2 qd  most days  . Drug Use: No  . Sexual Activity: Not on file   Other Topics Concern  . Not on file   Social History Narrative   Married, 1 step child , from [place]         Past Surgical History  Procedure Laterality Date  . Incision and drainage perirectal abscess  12-2007  . Bunionectomy Right ~2011    has a screw    Family History  Problem Relation Age of Onset  . Colon cancer Neg Hx   . Prostate cancer Neg Hx   . Diabetes Other     M, B x 3  . CAD Neg Hx   . Stroke Neg Hx     Allergies  Allergen Reactions  . Codeine Sulfate Nausea Only    Current Outpatient Prescriptions on File Prior to Visit  Medication Sig Dispense Refill  . FIBER PO Take 1  tablet by mouth daily.    . multivitamin (ONE-A-DAY MEN'S) TABS tablet Take 1 tablet by mouth daily.    Marland Kitchen omeprazole (PRILOSEC OTC) 20 MG tablet Take 20 mg by mouth daily.    Marland Kitchen OVER THE COUNTER MEDICATION Hydroxycut: OTC Diet Pill: 1 tablet daily.    . sildenafil (VIAGRA) 100 MG tablet Take 0.5-1 tablets (50-100 mg total) by mouth daily as needed for erectile dysfunction. 3 tablet 6   No current facility-administered medications on file prior to visit.    BP 124/78 mmHg  Pulse 68  Temp(Src) 98 F (36.7 C) (Oral)  Ht 5\' 5"  (1.651 m)  Wt 192 lb (87.091 kg)  BMI 31.95 kg/m2  SpO2 98%       Objective:   Physical Exam  General- No acute distress. Pleasant patient.  Lungs- Clear, even and unlabored. Heart- regular rate and rhythm. Neurologic- CNII- XII grossly intact.  Rt hand- pt can full extend his 2nd, 3rd, 4th and 5th digit. He can't full extend  thumb. Pt can pull back thumb at dip joint due to pain.  Negative finklestein test. Mild pain ventral aspect base of thumb on palpation.     Assessment & Plan:  For your thumb pain and inability to full extend them will refer you to hand specialist.  We will go ahead and get x-rays today.   I would recommend you not doing manual repetitive type work until you get evaluated.  Diclofenac rx. For pain and inflammation if needed  Follow up with [place] as needed  Kel Senn, Percell Miller, [employer]

## 2015-09-18 NOTE — Patient Instructions (Addendum)
For your thumb pain and inability to full extend them will refer you to hand specialist.  We will go ahead and get x-rays today.   I would recommend you not doing manual repetitive type work until you get evaluated.  Rx diclofenac.  Follow up with [place] as needed

## 2015-09-18 NOTE — Progress Notes (Signed)
Pre visit review using our clinic review tool, if applicable. No additional management support is needed unless otherwise documented below in the visit note. 

## 2015-11-04 ENCOUNTER — Telehealth: Payer: Self-pay | Admitting: Medical

## 2015-11-04 NOTE — Telephone Encounter (Signed)
Rec'd from [facility] forwarded 4 pages to Dr. Mackie Pai

## 2015-11-13 NOTE — Telephone Encounter (Signed)
This note was entered by mistake. 11/13/15 ab

## 2016-05-05 ENCOUNTER — Ambulatory Visit (INDEPENDENT_AMBULATORY_CARE_PROVIDER_SITE_OTHER): Payer: Managed Care, Other (non HMO) | Admitting: Internal Medicine

## 2016-05-05 ENCOUNTER — Encounter: Payer: Self-pay | Admitting: Internal Medicine

## 2016-05-05 VITALS — BP 122/76 | HR 75 | Temp 97.5°F | Resp 14 | Ht 65.0 in | Wt 192.5 lb

## 2016-05-05 DIAGNOSIS — Z0000 Encounter for general adult medical examination without abnormal findings: Secondary | ICD-10-CM | POA: Diagnosis not present

## 2016-05-05 DIAGNOSIS — Z23 Encounter for immunization: Secondary | ICD-10-CM | POA: Diagnosis not present

## 2016-05-05 LAB — BASIC METABOLIC PANEL
BUN: 15 mg/dL (ref 6–23)
CHLORIDE: 105 meq/L (ref 96–112)
CO2: 25 mEq/L (ref 19–32)
Calcium: 9.3 mg/dL (ref 8.4–10.5)
Creatinine, Ser: 0.92 mg/dL (ref 0.40–1.50)
GFR: 93.31 mL/min (ref >60.00)
Glucose, Bld: 99 mg/dL (ref 70–99)
POTASSIUM: 4 meq/L (ref 3.5–5.1)
Sodium: 139 mEq/L (ref 135–145)

## 2016-05-05 LAB — LIPID PANEL
CHOLESTEROL: 150 mg/dL (ref 0–200)
HDL: 44.3 mg/dL (ref >39.00)
LDL CALC: 73 mg/dL (ref 0–99)
NonHDL: 105.59
TRIGLYCERIDES: 162 mg/dL — AB (ref 0.0–149.0)
Total CHOL/HDL Ratio: 3
VLDL: 32.4 mg/dL (ref 0.0–40.0)

## 2016-05-05 LAB — HEMOGLOBIN A1C: Hgb A1c MFr Bld: 5.4 % (ref 4.6–6.5)

## 2016-05-05 MED ORDER — SILDENAFIL CITRATE 100 MG PO TABS: 50.0000 mg | ORAL_TABLET | Freq: Every day | ORAL | 6 refills | Status: DC | PRN

## 2016-05-05 NOTE — Assessment & Plan Note (Signed)
Here for a CPX, other than a URI he is doing well. Refill Viagra RTC one year

## 2016-05-05 NOTE — Progress Notes (Signed)
Subjective:    Patient ID: Drew Estrada, male    DOB: 04/02/1966, 51 y.o.   MRN: [id number]  DOS:  05/05/2016 Type of visit - description : Physical exam Interval history: In general doing well except for the last few days, developed a URI   Review of Systems Few days ago had  sore throat, nasal congestion, taking OTCs, DayQuil and NyQuil. Already feeling better. Constitutional: No fever. No chills. No unexplained wt changes. No unusual sweats  HEENT: No dental problems, no ear discharge, no facial swelling, no voice changes. No eye discharge, no eye  redness , no  intolerance to light   Respiratory: No wheezing , no  difficulty breathing. Mild cough , no mucus production  Cardiovascular: No CP, no leg swelling , no  Palpitations  GI: no nausea, no vomiting, no diarrhea , no  abdominal pain.  No blood in the stools. No dysphagia, no odynophagia    Endocrine: No polyphagia, no polyuria , no polydipsia  GU: No dysuria, gross hematuria, difficulty urinating. No urinary urgency, no frequency.  Musculoskeletal: No joint swellings or unusual aches or pains  Skin: No change in the color of the skin, palor , no  Rash  Allergic, immunologic: No environmental allergies , no  food allergies  Neurological: No dizziness no  syncope. No headaches. No diplopia, no slurred, no slurred speech, no motor deficits, no facial  Numbness  Hematological: No enlarged lymph nodes, no easy bruising , no unusual bleedings  Psychiatry: No suicidal ideas, no hallucinations, no beavior problems, no confusion.  No unusual/severe anxiety, no depression   Past Medical History:  Diagnosis Date  . H/O colonoscopy 2007    for blood in stools: neg except for int. hemorrhoids     Past Surgical History:  Procedure Laterality Date  . BUNIONECTOMY Right ~2011   has a screw  . INCISION AND DRAINAGE PERIRECTAL ABSCESS  12-2007    Social History   Social History  . Marital status: Married    Spouse  name: N/A  . Number of children: 1  . Years of education: N/A   Occupational History  . restaurant business     Social History Main Topics  . Smoking status: Light Tobacco Smoker    Types: Cigarettes  . Smokeless tobacco: Never Used     Comment: smokes rarely, cigars   . Alcohol use 0.0 oz/week     Comment: beer x 2 qd  most days  . Drug use: No  . Sexual activity: Not on file   Other Topics Concern  . Not on file   Social History Narrative   Married, 1 step child , from [place]          Family History  Problem Relation Age of Onset  . Diabetes Other     M, B x 3  . Colon cancer Neg Hx   . Prostate cancer Neg Hx   . CAD Neg Hx   . Stroke Neg Hx      Allergies as of 05/05/2016      Reactions   Codeine Sulfate Nausea Only      Medication List       Accurate as of 05/05/16  4:49 PM. Always use your most recent med list.          FIBER PO Take 1 tablet by mouth daily.   multivitamin Tabs tablet Take 1 tablet by mouth daily.   omeprazole 20 MG tablet Commonly known as:  PRILOSEC OTC Take 20 mg by mouth daily.   OVER THE COUNTER MEDICATION Hydroxycut: OTC Diet Pill: 1 tablet daily.   sildenafil 100 MG tablet Commonly known as:  VIAGRA Take 0.5-1 tablets (50-100 mg total) by mouth daily as needed for erectile dysfunction.          Objective:   Physical Exam BP 122/76 (BP Location: Left Arm, Patient Position: Sitting, Cuff Size: Normal)   Pulse 75   Temp 97.5 F (36.4 C) (Oral)   Resp 14   Ht 5\' 5"  (1.651 m)   Wt 192 lb 8 oz (87.3 kg)   SpO2 98%   BMI 32.03 kg/m   General:   Well developed, well nourished . NAD.  Neck: No  thyromegaly  HEENT:  Normocephalic . Face symmetric, atraumatic. TMs normal, nose quite congested, sinuses no TTP. Throat symmetric, no red Lungs:  CTA B Normal respiratory effort, no intercostal retractions, no accessory muscle use. Heart: RRR,  no murmur.  No pretibial edema bilaterally  Abdomen:  Not distended,  soft, non-tender. No rebound or rigidity.   Skin: Exposed areas without rash. Not pale. Not jaundice Neurologic:  alert & oriented X3.  Speech normal, gait appropriate for age and unassisted Strength symmetric and appropriate for age.  Psych: Cognition and judgment appear intact.  Cooperative with normal attention span and concentration.  Behavior appropriate. No anxious or depressed appearing.    Assessment & Plan:   Assessment  GERD Hemorrhoids ED (viagra prn)  PLAN Here for a CPX, other than a URI he is doing well. Refill Viagra RTC one year

## 2016-05-05 NOTE — Patient Instructions (Signed)
GO TO THE LAB : Get the blood work     GO TO THE FRONT DESK Schedule your next appointment for a  Physical  exam in one year   Continue taking over-the-counter medication for your cold, if you are not completely well in 10 days let me know

## 2016-05-05 NOTE — Assessment & Plan Note (Signed)
Td 04-2016; had a Flu shot  Had a  Cscope in 2007 for blood in stools: neg except for int. hemorrhoids  Diet and exercise discussed. Labs: BMP, FLP, A1c

## 2016-05-05 NOTE — Progress Notes (Signed)
Pre visit review using our clinic review tool, if applicable. No additional management support is needed unless otherwise documented below in the visit note. 

## 2016-11-23 ENCOUNTER — Ambulatory Visit (INDEPENDENT_AMBULATORY_CARE_PROVIDER_SITE_OTHER): Payer: Managed Care, Other (non HMO) | Admitting: Internal Medicine

## 2016-11-23 ENCOUNTER — Encounter: Payer: Self-pay | Admitting: Internal Medicine

## 2016-11-23 VITALS — BP 126/70 | HR 88 | Temp 98.1°F | Ht 65.0 in | Wt 192.1 lb

## 2016-11-23 DIAGNOSIS — J40 Bronchitis, not specified as acute or chronic: Secondary | ICD-10-CM

## 2016-11-23 MED ORDER — AZITHROMYCIN 250 MG PO TABS: ORAL_TABLET | ORAL | 0 refills | Status: DC

## 2016-11-23 NOTE — Assessment & Plan Note (Signed)
Bronchitis: sx c/w bronchitis. Will treat with a Z-Pak. See instructions.  Hemoptysis: Likely related to bronchitis.   He recently took a airplane trip but denies calf pain, leg swelling, palpitations. No evidence of DVT or pneumonia on clinical grounds.

## 2016-11-23 NOTE — Patient Instructions (Signed)
Rest, fluids , tylenol  For cough:  Take Mucinex DM twice a day as needed until better  If  nasal congestion: Use OTC   Flonase : 2 nasal sprays on each side of the nose in the morning until you feel better   Avoid decongestants such as  Pseudoephedrine or phenylephrine    Take the antibiotic as prescribed  (zithromax )  Call if not gradually better over the next  10 days  Call anytime if the symptoms are severe, you have high fever, short of breath, chest pain, more blood with cough

## 2016-11-23 NOTE — Progress Notes (Signed)
Pre visit review using our clinic review tool, if applicable. No additional management support is needed unless otherwise documented below in the visit note. 

## 2016-11-23 NOTE — Progress Notes (Signed)
Subjective:    Patient ID: Drew Estrada, male    DOB: 04-30-1965, 51 y.o.   MRN: [id number]  DOS:  11/23/2016 Type of visit - description : acute Interval history: Symptoms started 11/19/2016: Itchy throat, fleeting accumulation in the throat, nose bleeding from the left nostril. The next day, took an airplane, he was coming back from [place] after a one-week visit. That day he developed cough, small amount of sputum production, greenish in color. Took Advil. Today the cough increased and she noted traces of blood in the greenish sputum.  Review of Systems Denies fever chills Very mild sinus pain and congestion. No chest pain or difficulty breathing. No wheezing. No myalgias or headaches Denies calf pain or lower extremity edema.  Past Medical History:  Diagnosis Date  . H/O colonoscopy 2007    for blood in stools: neg except for int. hemorrhoids     Past Surgical History:  Procedure Laterality Date  . BUNIONECTOMY Right ~2011   has a screw  . INCISION AND DRAINAGE PERIRECTAL ABSCESS  12-2007    Social History   Social History  . Marital status: Married    Spouse name: N/A  . Number of children: 1  . Years of education: N/A   Occupational History  . restaurant business     Social History Main Topics  . Smoking status: Light Tobacco Smoker    Types: Cigarettes  . Smokeless tobacco: Never Used     Comment: smokes rarely, cigars   . Alcohol use 0.0 oz/week     Comment: beer x 2 qd  most days  . Drug use: No  . Sexual activity: Not on file   Other Topics Concern  . Not on file   Social History Narrative   Married, 1 step child , from [place]           Allergies as of 11/23/2016      Reactions   Codeine Sulfate Nausea Only      Medication List       Accurate as of 11/23/16  9:29 PM. Always use your most recent med list.          azithromycin 250 MG tablet Commonly known as:  ZITHROMAX Z-PAK 2 tabs a day the first day, then 1 tab a day x 4  days   FIBER PO Take 1 tablet by mouth daily.   multivitamin Tabs tablet Take 1 tablet by mouth daily.   omeprazole 20 MG tablet Commonly known as:  PRILOSEC OTC Take 20 mg by mouth daily.   OVER THE COUNTER MEDICATION Hydroxycut: OTC Diet Pill: 1 tablet daily.   sildenafil 100 MG tablet Commonly known as:  VIAGRA Take 0.5-1 tablets (50-100 mg total) by mouth daily as needed for erectile dysfunction.          Objective:   Physical Exam BP 126/70 (BP Location: Left Arm, Patient Position: Sitting, Cuff Size: Normal)   Pulse 88   Temp 98.1 F (36.7 C) (Oral)   Ht 5\' 5"  (1.651 m)   Wt 192 lb 2 oz (87.1 kg)   SpO2 97%   BMI 31.97 kg/m  General:   Well developed, well nourished. Nontoxic appearing HEENT:  Normocephalic . Face symmetric, atraumatic. Right TM normal, left TM slightly red. Throat symmetric, not red. Sinuses no TTP Lungs:  CTA B Normal respiratory effort, no intercostal retractions, no accessory muscle use. Heart: RRR,  no murmur.  No pretibial edema bilaterally  Skin: Not pale. Not  jaundice Neurologic:  alert & oriented X3.  Speech normal, gait appropriate for age and unassisted Psych--  Cognition and judgment appear intact.  Cooperative with normal attention span and concentration.  Behavior appropriate. No anxious or depressed appearing.      Assessment & Plan:   Assessment  GERD Hemorrhoids ED (viagra prn)  PLAN Bronchitis: sx c/w bronchitis. Will treat with a Z-Pak. See instructions.  Hemoptysis: Likely related to bronchitis.   He recently took a airplane trip but denies calf pain, leg swelling, palpitations. No evidence of DVT or pneumonia on clinical grounds.

## 2016-11-25 ENCOUNTER — Ambulatory Visit (INDEPENDENT_AMBULATORY_CARE_PROVIDER_SITE_OTHER): Payer: Managed Care, Other (non HMO) | Admitting: Internal Medicine

## 2016-11-25 ENCOUNTER — Encounter: Payer: Self-pay | Admitting: Internal Medicine

## 2016-11-25 VITALS — BP 124/70 | HR 61 | Temp 98.1°F | Resp 14 | Ht 65.0 in | Wt 192.1 lb

## 2016-11-25 DIAGNOSIS — L729 Follicular cyst of the skin and subcutaneous tissue, unspecified: Secondary | ICD-10-CM | POA: Diagnosis not present

## 2016-11-25 NOTE — Progress Notes (Signed)
Subjective:    Patient ID: Drew Estrada, male    DOB: 01-27-1966, 51 y.o.   MRN: [id number]  DOS:  11/25/2016 Type of visit - description : acute, forgot to mention this problem yesterday. Interval history: Felt a lump at the scrotum 2 weeks ago. Area is not tender, no discharge. Was seen with bronchitis, feeling better. No further hemoptysis   Review of Systems  Denies dysuria, gross hematuria. No penile discharge.  Past Medical History:  Diagnosis Date  . H/O colonoscopy 2007    for blood in stools: neg except for int. hemorrhoids     Past Surgical History:  Procedure Laterality Date  . BUNIONECTOMY Right ~2011   has a screw  . INCISION AND DRAINAGE PERIRECTAL ABSCESS  12-2007    Social History   Social History  . Marital status: Married    Spouse name: N/A  . Number of children: 1  . Years of education: N/A   Occupational History  . restaurant business     Social History Main Topics  . Smoking status: Light Tobacco Smoker    Types: Cigarettes  . Smokeless tobacco: Never Used     Comment: smokes rarely, cigars   . Alcohol use 0.0 oz/week     Comment: beer x 2 qd  most days  . Drug use: No  . Sexual activity: Not on file   Other Topics Concern  . Not on file   Social History Narrative   Married, 1 step child , from [place]           Allergies as of 11/25/2016      Reactions   Codeine Sulfate Nausea Only      Medication List       Accurate as of 11/25/16  8:49 AM. Always use your most recent med list.          azithromycin 250 MG tablet Commonly known as:  ZITHROMAX Z-PAK 2 tabs a day the first day, then 1 tab a day x 4 days   FIBER PO Take 1 tablet by mouth daily.   multivitamin Tabs tablet Take 1 tablet by mouth daily.   omeprazole 20 MG tablet Commonly known as:  PRILOSEC OTC Take 20 mg by mouth daily.   OVER THE COUNTER MEDICATION Hydroxycut: OTC Diet Pill: 1 tablet daily.   sildenafil 100 MG tablet Commonly known as:   VIAGRA Take 0.5-1 tablets (50-100 mg total) by mouth daily as needed for erectile dysfunction.          Objective:   Physical Exam  Genitourinary:      BP 124/70 (BP Location: Left Arm, Patient Position: Sitting, Cuff Size: Normal)   Pulse 61   Temp 98.1 F (36.7 C) (Oral)   Resp 14   Ht 5\' 5"  (1.651 m)   Wt 192 lb 2 oz (87.1 kg)   SpO2 97%   BMI 31.97 kg/m  General:   Well developed, well nourished . NAD.  HEENT:  Normocephalic . Face symmetric, atraumatic  GU: Normal testicles palpated. Neurologic:  alert & oriented X3.  Speech normal, gait appropriate for age and unassisted Psych--  Cognition and judgment appear intact.  Cooperative with normal attention span and concentration.  Behavior appropriate. No anxious or depressed appearing.      Assessment & Plan:   Assessment  GERD Hemorrhoids ED (viagra prn)  PLAN Cyst: Findings consistent with a skin cyst versus dermatofibroma. Recommend observation, self-examination, call if the area cause pain or  start changing and growing. Testicles are normal Bronchitis: Improving, no further hemoptysis

## 2016-11-25 NOTE — Assessment & Plan Note (Signed)
Cyst: Findings consistent with a skin cyst versus dermatofibroma. Recommend observation, self-examination, call if the area cause pain or start changing and growing. Testicles are normal Bronchitis: Improving, no further hemoptysis

## 2016-11-25 NOTE — Progress Notes (Signed)
Pre visit review using our clinic review tool, if applicable. No additional management support is needed unless otherwise documented below in the visit note. 

## 2017-02-05 ENCOUNTER — Encounter: Payer: Self-pay | Admitting: Podiatry

## 2017-02-05 NOTE — Patient Instructions (Signed)

## 2017-02-17 ENCOUNTER — Ambulatory Visit (INDEPENDENT_AMBULATORY_CARE_PROVIDER_SITE_OTHER): Payer: Managed Care, Other (non HMO)

## 2017-02-17 ENCOUNTER — Encounter: Payer: Self-pay | Admitting: Podiatry

## 2017-02-17 ENCOUNTER — Ambulatory Visit (INDEPENDENT_AMBULATORY_CARE_PROVIDER_SITE_OTHER): Payer: Managed Care, Other (non HMO) | Admitting: Podiatry

## 2017-02-17 DIAGNOSIS — M722 Plantar fascial fibromatosis: Secondary | ICD-10-CM | POA: Diagnosis not present

## 2017-02-17 DIAGNOSIS — M2010 Hallux valgus (acquired), unspecified foot: Secondary | ICD-10-CM

## 2017-02-17 MED ORDER — TRIAMCINOLONE ACETONIDE 10 MG/ML IJ SUSP
10.0000 mg | Freq: Once | INTRAMUSCULAR | Status: AC
  Administered 2017-02-17: 10 mg

## 2017-02-17 NOTE — Progress Notes (Signed)
   Subjective:    Patient ID: Drew Estrada, male    DOB: 1965-08-12, 51 y.o.   MRN: [id number]  HPI    Review of Systems  All other systems reviewed and are negative.      Objective:   Physical Exam        Assessment & Plan:

## 2017-02-17 NOTE — Patient Instructions (Addendum)
Bunion A bunion is a bump on the base of the big toe that forms when the bones of the big toe joint move out of position. Bunions may be small at first, but they often get larger over time. The can make walking painful. What are the causes? A bunion may be caused by:  Wearing narrow or pointed shoes that force the big toe to press against the other toes.  Abnormal foot development that causes the foot to roll inward (pronate).  Changes in the foot that are caused by certain diseases, such as rheumatoid arthritis and polio.  A foot injury.  What increases the risk? The following factors may make you more likely to develop this condition:  Wearing shoes that squeeze the toes together.  Having certain diseases, such as: ? Rheumatoid arthritis. ? Polio. ? Cerebral palsy.  Having family members who have bunions.  Being born with a foot deformity, such as flat feet or low arches.  Doing activities that put a lot of pressure on the feet, such as ballet dancing.  What are the signs or symptoms? The main symptom of a bunion is a noticeable bump on the big toe. Other symptoms may include:  Pain.  Swelling around the big toe.  Redness and inflammation.  Thick or hardened skin on the big toe or between the toes.  Stiffness or loss of motion in the big toe.  Trouble with walking.  How is this diagnosed? A bunion may be diagnosed based on your symptoms, medical history, and activities. You may have tests, such as:  X-rays. These allow your health care provider to check the position of the bones in your foot and look for damage to your joint. They also help your health care provider to determine the severity of your bunion and the best way to treat it.  Joint aspiration. In this test, a sample of fluid is removed from the toe joint. This test, which may be done if you are in a lot of pain, helps to rule out diseases that cause painful swelling of the joints, such as  arthritis.  How is this treated? There is no cure for a bunion, but treatment can help to prevent a bunion from getting worse. Treatment depends on the severity of your symptoms. Your health care provider may recommend:  Wearing shoes that have a wide toe box.  Using bunion pads to cushion the affected area.  Taping your toes together to keep them in a normal position.  Placing a device inside your shoe (orthotics) to help reduce pressure on your toe joint.  Taking medicine to ease pain, inflammation, and swelling.  Applying heat or ice to the affected area.  Doing stretching exercises.  Surgery to remove scar tissue and move the toes back into their normal position. This treatment is rare.  Follow these instructions at home:  Support your toe joint with proper footwear, shoe padding, or taping as told by your health care provider.  Take over-the-counter and prescription medicines only as told by your health care provider.  If directed, apply ice to the injured area: ? Put ice in a plastic bag. ? Place a towel between your skin and the bag. ? Leave the ice on for 20 minutes, 2-3 times per day.  If directed, apply heat to the affected area before you exercise. Use the heat source that your health care provider recommends, such as a moist heat pack or a heating pad. ? Place a towel between your   skin and the heat source. ? Leave the heat on for 20-30 minutes. ? Remove the heat if your skin turns bright red. This is especially important if you are unable to feel pain, heat, or cold. You may have a greater risk of getting burned.  Do exercises as told by your health care provider.  Keep all follow-up visits as told by your health care provider. Contact a health care provider if:  Your symptoms get worse.  Your symptoms do not improve in 2 weeks. Get help right away if:  You have severe pain and trouble with walking. This information is not intended to replace advice given  to you by your health care provider. Make sure you discuss any questions you have with your health care provider. Document Released: 04/13/2005 Document Revised: 09/19/2015 Document Reviewed: 11/11/2014 Elsevier Interactive Patient Education  2018 [employer]  Pre-Operative Instructions  Congratulations, you have decided to take an important step towards improving your quality of life.  You can be assured that the doctors and staff at [facility] will be with you every step of the way.  Here are some important things you should know:  1. Plan to be at the surgery center/hospital at least 1 (one) hour prior to your scheduled time, unless otherwise directed by the surgical center/hospital staff.  You must have a responsible adult accompany you, remain during the surgery and drive you home.  Make sure you have directions to the surgical center/hospital to ensure you arrive on time. 2. If you are having surgery at [facility] or [facility], you will need a copy of your medical history and physical form from your family physician within one month prior to the date of surgery. We will give you a form for your primary physician to complete.  3. We make every effort to accommodate the date you request for surgery.  However, there are times where surgery dates or times have to be moved.  We will contact you as soon as possible if a change in schedule is required.   4. No aspirin/ibuprofen for one week before surgery.  If you are on aspirin, any non-steroidal anti-inflammatory medications (Mobic, Aleve, Ibuprofen) should not be taken seven (7) days prior to your surgery.  You make take Tylenol for pain prior to surgery.  5. Medications - If you are taking daily heart and blood pressure medications, seizure, reflux, allergy, asthma, anxiety, pain or diabetes medications, make sure you notify the surgery center/hospital before the day of surgery so they can tell you which medications you should  take or avoid the day of surgery. 6. No food or drink after midnight the night before surgery unless directed otherwise by surgical center/hospital staff. 7. No alcoholic beverages 24-hours prior to surgery.  No smoking 24-hours prior or 24-hours after surgery. 8. Wear loose pants or shorts. They should be loose enough to fit over bandages, boots, and casts. 9. Don't wear slip-on shoes. Sneakers are preferred. 10. Bring your boot with you to the surgery center/hospital.  Also bring crutches or a walker if your physician has prescribed it for you.  If you do not have this equipment, it will be provided for you after surgery. 11. If you have not been contacted by the surgery center/hospital by the day before your surgery, call to confirm the date and time of your surgery. 12. Leave-time from work may vary depending on the type of surgery you have.  Appropriate arrangements should be made prior   to surgery with your employer. 13. Prescriptions will be provided immediately following surgery by your doctor.  Fill these as soon as possible after surgery and take the medication as directed. Pain medications will not be refilled on weekends and must be approved by the doctor. 14. Remove nail polish on the operative foot and avoid getting pedicures prior to surgery. 15. Wash the night before surgery.  The night before surgery wash the foot and leg well with water and the antibacterial soap provided. Be sure to pay special attention to beneath the toenails and in between the toes.  Wash for at least three (3) minutes. Rinse thoroughly with water and dry well with a towel.  Perform this wash unless told not to do so by your physician.  Enclosed: 1 Ice pack (please put in freezer the night before surgery)   1 Hibiclens skin cleaner   Pre-op instructions  If you have any questions regarding the instructions, please do not hesitate to call our office.  [place]: [address], [place], [place] [address] --  [phone]  [place]: [address] [place], [place] [address] -- [phone]  [place]: [address]  [place], [place] [address] -- [phone]  [place]: [address], [address], [place], [place] [address] -- [phone]  Website: [email or web address]  

## 2017-02-18 NOTE — Progress Notes (Signed)
Subjective:    Patient ID: Drew Estrada, male   DOB: 51 y.o.   MRN: [id number]   HPI patient states that he's very active and is on his feet a lot and is not a lot of pain in the plantar aspect of his heel and arch over the last few months and he has chronic bunion deformity left it's becoming increasingly bothersome after having had his right one fixed by [place] about 5 years ago. Patient states she's tried wider shoes and other modalities for the bunion without relief and he currently is not smoking    Review of Systems  All other systems reviewed and are negative.       Objective:  Physical Exam  Constitutional: He appears well-developed and well-nourished.  Cardiovascular: Intact distal pulses.   Pulmonary/Chest: Effort normal.  Musculoskeletal: Normal range of motion.  Neurological: He is alert.  Skin: Skin is warm.  Nursing note and vitals reviewed.  neurovascular status intact muscle strength adequate range of motion within normal limits with patient found to have a large painful bunion deformity left with redness and irritation and has exquisite discomfort plantar aspect left heel at the insertional point tendon into the calcaneus. 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 Patient discussed surgical intervention for bunion and I do think distal osteotomy would do very well for him and I reviewed this and he wants to get it done in January and we will discuss this prior to procedure  X-rays indicate elevation of the intermetatarsal angle left of approximate 15 and well corrected bunion right with fixation

## 2017-02-24 NOTE — Progress Notes (Signed)
This encounter was created in error - please disregard.

## 2017-03-03 ENCOUNTER — Encounter: Payer: Self-pay | Admitting: Podiatry

## 2017-03-03 ENCOUNTER — Ambulatory Visit (INDEPENDENT_AMBULATORY_CARE_PROVIDER_SITE_OTHER): Payer: Managed Care, Other (non HMO) | Admitting: Podiatry

## 2017-03-03 DIAGNOSIS — M722 Plantar fascial fibromatosis: Secondary | ICD-10-CM

## 2017-03-03 DIAGNOSIS — M2010 Hallux valgus (acquired), unspecified foot: Secondary | ICD-10-CM

## 2017-03-03 NOTE — Patient Instructions (Signed)
Pre-Operative Instructions  Congratulations, you have decided to take an important step towards improving your quality of life.  You can be assured that the doctors and staff at [facility] will be with you every step of the way.  Here are some important things you should know:  1. Plan to be at the surgery center/hospital at least 1 (one) hour prior to your scheduled time, unless otherwise directed by the surgical center/hospital staff.  You must have a responsible adult accompany you, remain during the surgery and drive you home.  Make sure you have directions to the surgical center/hospital to ensure you arrive on time. 2. If you are having surgery at [facility] or [facility], you will need a copy of your medical history and physical form from your family physician within one month prior to the date of surgery. We will give you a form for your primary physician to complete.  3. We make every effort to accommodate the date you request for surgery.  However, there are times where surgery dates or times have to be moved.  We will contact you as soon as possible if a change in schedule is required.   4. No aspirin/ibuprofen for one week before surgery.  If you are on aspirin, any non-steroidal anti-inflammatory medications (Mobic, Aleve, Ibuprofen) should not be taken seven (7) days prior to your surgery.  You make take Tylenol for pain prior to surgery.  5. Medications - If you are taking daily heart and blood pressure medications, seizure, reflux, allergy, asthma, anxiety, pain or diabetes medications, make sure you notify the surgery center/hospital before the day of surgery so they can tell you which medications you should take or avoid the day of surgery. 6. No food or drink after midnight the night before surgery unless directed otherwise by surgical center/hospital staff. 7. No alcoholic beverages 24-hours prior to surgery.  No smoking 24-hours prior or 24-hours after  surgery. 8. Wear loose pants or shorts. They should be loose enough to fit over bandages, boots, and casts. 9. Don't wear slip-on shoes. Sneakers are preferred. 10. Bring your boot with you to the surgery center/hospital.  Also bring crutches or a walker if your physician has prescribed it for you.  If you do not have this equipment, it will be provided for you after surgery. 11. If you have not been contacted by the surgery center/hospital by the day before your surgery, call to confirm the date and time of your surgery. 12. Leave-time from work may vary depending on the type of surgery you have.  Appropriate arrangements should be made prior to surgery with your employer. 13. Prescriptions will be provided immediately following surgery by your doctor.  Fill these as soon as possible after surgery and take the medication as directed. Pain medications will not be refilled on weekends and must be approved by the doctor. 14. Remove nail polish on the operative foot and avoid getting pedicures prior to surgery. 15. Wash the night before surgery.  The night before surgery wash the foot and leg well with water and the antibacterial soap provided. Be sure to pay special attention to beneath the toenails and in between the toes.  Wash for at least three (3) minutes. Rinse thoroughly with water and dry well with a towel.  Perform this wash unless told not to do so by your physician.  Enclosed: 1 Ice pack (please put in freezer the night before surgery)   1 Hibiclens skin cleaner     Pre-op instructions  If you have any questions regarding the instructions, please do not hesitate to call our office.  [place]: [address], [place], [place] [address] -- [phone]  [place]: [address] [place], [place] [address] -- [phone]  [place]: [address]  [place], [place] [address] -- [phone]  [place]: [address], [address], [place],  [address] -- [phone]  Website:  [email or web address] 

## 2017-03-03 NOTE — Progress Notes (Signed)
Subjective:    Patient ID: Drew Estrada, male   DOB: 51 y.o.   MRN: [id number]   HPI patient states doing really well and ready to get this bunion fixed    ROS      Objective:  Physical Exam neurovascular status intact with diminished discomfort plantar heel left at the insertional point tendon into the calcaneus with large structural bunion deformity left and history of having had his right one fixed several years ago     Assessment:   Plantar fasciitis left that is inflamed with structural bunion deformity left and significant diminishment of pain plantar fascial when palpated      Plan:    Reviewed condition and discussed bunion correction and at this point reviewed consent form with patient going over the procedure and risk. Patient understands alternative treatments complications wants surgery and signs consent form after extensive review. Patient will utilize the brace and stretching for the plantar fascia and understands recovery before the surgery will be 6 months to one year and he understands everything as out

## 2017-05-04 DIAGNOSIS — M2012 Hallux valgus (acquired), left foot: Secondary | ICD-10-CM | POA: Diagnosis not present

## 2017-05-05 DIAGNOSIS — M2012 Hallux valgus (acquired), left foot: Secondary | ICD-10-CM | POA: Diagnosis not present

## 2017-05-07 ENCOUNTER — Encounter: Payer: Managed Care, Other (non HMO) | Admitting: Internal Medicine

## 2017-05-07 DIAGNOSIS — Z0289 Encounter for other administrative examinations: Secondary | ICD-10-CM

## 2017-05-12 ENCOUNTER — Encounter: Payer: Self-pay | Admitting: Podiatry

## 2017-05-12 ENCOUNTER — Ambulatory Visit (INDEPENDENT_AMBULATORY_CARE_PROVIDER_SITE_OTHER): Payer: Managed Care, Other (non HMO) | Admitting: Podiatry

## 2017-05-12 ENCOUNTER — Ambulatory Visit (INDEPENDENT_AMBULATORY_CARE_PROVIDER_SITE_OTHER): Payer: Managed Care, Other (non HMO)

## 2017-05-12 VITALS — BP 125/86 | HR 82 | Temp 97.8°F

## 2017-05-12 DIAGNOSIS — M722 Plantar fascial fibromatosis: Secondary | ICD-10-CM

## 2017-05-12 DIAGNOSIS — M2010 Hallux valgus (acquired), unspecified foot: Secondary | ICD-10-CM

## 2017-05-12 NOTE — Progress Notes (Signed)
DOS 05/04/2017 [facility]

## 2017-05-13 NOTE — Progress Notes (Signed)
Subjective:   Patient ID: Drew Estrada, male   DOB: 52 y.o.   MRN: [id number]   HPI Patient states she is feeling really good states is having minimal discomfort and admits he is been somewhat active on his foot   ROS      Objective:  Physical Exam  Neurovascular status intact negative Homans sign was noted with wound edges well coapted hallux in rectus position with good range of motion overall doing well post surgical procedure left with patient being moderately     Assessment:  Active over what he should be     Plan:  Reviewed condition and at this time went ahead and instructed on continued immobilization compression elevation and range of motion exercises for the first MPJ.  Reappoint 4 weeks or earlier if needed  X-rays indicate the osteotomy is healing well with slight pressure on the dorsal side do want to keep it immobilized but no indications of remote movement with slight overcorrection which will have him keep his toe and over valgus position and that should be fine

## 2017-05-18 ENCOUNTER — Other Ambulatory Visit: Payer: Self-pay | Admitting: Internal Medicine

## 2017-06-02 ENCOUNTER — Ambulatory Visit (INDEPENDENT_AMBULATORY_CARE_PROVIDER_SITE_OTHER): Payer: Managed Care, Other (non HMO)

## 2017-06-02 ENCOUNTER — Ambulatory Visit (INDEPENDENT_AMBULATORY_CARE_PROVIDER_SITE_OTHER): Payer: Managed Care, Other (non HMO) | Admitting: Podiatry

## 2017-06-02 ENCOUNTER — Encounter: Payer: Self-pay | Admitting: Podiatry

## 2017-06-02 DIAGNOSIS — M2010 Hallux valgus (acquired), unspecified foot: Secondary | ICD-10-CM | POA: Diagnosis not present

## 2017-06-02 NOTE — Progress Notes (Signed)
Subjective:   Patient ID: Drew Estrada, male   DOB: 52 y.o.   MRN: [id number]   HPI Patient presents stating he is doing well with his bunion and did wear shoes with minimal discomfort   ROS      Objective:  Physical Exam  Neurovascular status intact with negative Homans sign noted patient found to have structural bunion deformity left that is been corrected with fixation in place good alignment joint congruous     Assessment:  Doing well at this time with good range of motion and structural position of the big toe joint with wound edges well coapted     Plan:  X-rays reviewed advised on gradual increase in activity and dispensed ankle stocking was dispensed.  Advised on continued immobilization reappoint to recheck  X-ray indicates good alignment of the osteotomy fixation in place

## 2017-07-14 ENCOUNTER — Ambulatory Visit (INDEPENDENT_AMBULATORY_CARE_PROVIDER_SITE_OTHER): Payer: Managed Care, Other (non HMO) | Admitting: Podiatry

## 2017-07-14 ENCOUNTER — Encounter: Payer: Self-pay | Admitting: Podiatry

## 2017-07-14 ENCOUNTER — Ambulatory Visit (INDEPENDENT_AMBULATORY_CARE_PROVIDER_SITE_OTHER): Payer: Managed Care, Other (non HMO)

## 2017-07-14 DIAGNOSIS — M2010 Hallux valgus (acquired), unspecified foot: Secondary | ICD-10-CM

## 2017-07-14 DIAGNOSIS — M2012 Hallux valgus (acquired), left foot: Secondary | ICD-10-CM

## 2017-07-14 NOTE — Progress Notes (Signed)
Subjective:   Patient ID: Drew Estrada, male   DOB: 52 y.o.   MRN: [id number]   HPI Patient presents stating doing real well with foot with minimal discomfort   ROS      Objective:  Physical Exam  Neurovascular status intact with patient's left first metatarsal healing well wound edges well coapted good alignment of the joint with no indications of movement     Assessment:  Doing well post osteotomy first metatarsal left     Plan:  Advised on continuation of elevation and gradual increase in activities and range of motion exercises.  Reappoint as needed  X-rays indicate the osteotomy is healing well good alignment noted joint congruous

## 2018-02-15 ENCOUNTER — Ambulatory Visit: Payer: Managed Care, Other (non HMO) | Admitting: Medical

## 2018-02-15 ENCOUNTER — Encounter: Payer: Self-pay | Admitting: Medical

## 2018-02-15 VITALS — BP 120/80 | HR 81 | Temp 98.1°F | Resp 16 | Ht 65.0 in | Wt 196.8 lb

## 2018-02-15 DIAGNOSIS — J329 Chronic sinusitis, unspecified: Secondary | ICD-10-CM

## 2018-02-15 DIAGNOSIS — R195 Other fecal abnormalities: Secondary | ICD-10-CM | POA: Diagnosis not present

## 2018-02-15 DIAGNOSIS — K921 Melena: Secondary | ICD-10-CM | POA: Diagnosis not present

## 2018-02-15 LAB — CBC WITH DIFFERENTIAL/PLATELET
Basophils Absolute: 0 10*3/uL (ref 0.0–0.1)
Basophils Relative: 0.5 % (ref 0.0–3.0)
EOS ABS: 0.1 10*3/uL (ref 0.0–0.7)
Eosinophils Relative: 1.4 % (ref 0.0–5.0)
HEMATOCRIT: 43.6 % (ref 39.0–52.0)
Hemoglobin: 15.1 g/dL (ref 13.0–17.0)
Lymphocytes Relative: 26.8 % (ref 12.0–46.0)
Lymphs Abs: 2.5 10*3/uL (ref 0.7–4.0)
MCHC: 34.6 g/dL (ref 30.0–36.0)
MCV: 91.6 fl (ref 78.0–100.0)
Monocytes Absolute: 0.7 10*3/uL (ref 0.1–1.0)
Monocytes Relative: 7.5 % (ref 3.0–12.0)
NEUTROS PCT: 63.8 % (ref 43.0–77.0)
Neutro Abs: 6 10*3/uL (ref 1.4–7.7)
PLATELETS: 273 10*3/uL (ref 150.0–400.0)
RBC: 4.77 Mil/uL (ref 4.22–5.81)
RDW: 12.4 % (ref 11.5–15.5)
WBC: 9.5 10*3/uL (ref 4.0–10.5)

## 2018-02-15 MED ORDER — HYDROCORTISONE ACETATE 25 MG RE SUPP: 25.0000 mg | Freq: Two times a day (BID) | RECTAL | 0 refills | Status: DC

## 2018-02-15 NOTE — Progress Notes (Signed)
Subjective:    Patient ID: Drew Estrada, male    DOB: 08-16-1965, 52 y.o.   MRN: [id number]  HPI   Pt in for evaluation.  Pt states last week he was nasal congested when he was in [place]. It was cold and rainy over there. Pt was having sinus pain. Was coughing up mucus and blowing out some mucus. He was treated with benzonatate, flonase and tessalon pearls. Has been on this treatment for 4 days and feeling better.  Pt states yesterday he saw some bright red blood in toilet water. Pt rectum has not been itching. No obvious hemmorroid. 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 Repair done at [facility]. Procedure in past done by central [facility]. Not mentioned perirectal abscess. Maybe later found fistula? Pt state prior colonoscopy.  Pt denies any recent constipation preceding the bright red blood.      Review of Systems  Constitutional: Negative for chills, fatigue and fever.  HENT: Positive for congestion, sinus pressure and sinus pain. Negative for sore throat and tinnitus.        See hpi.  Respiratory: Positive for cough. Negative for chest tightness, shortness of breath and wheezing.        See hpi.  Cardiovascular: Negative for chest pain and palpitations.  Gastrointestinal: Negative for abdominal pain.       See hpi  Musculoskeletal: Negative for back pain, myalgias and neck stiffness.  Skin: Negative for rash.  Neurological: Negative for dizziness and light-headedness.  Hematological: Negative for adenopathy. Does not bruise/bleed easily.  Psychiatric/Behavioral: Negative for behavioral problems and confusion. The patient is not nervous/anxious.    Past Medical History:  Diagnosis Date  . H/O colonoscopy 2007    for blood in stools: neg except for int. hemorrhoids      Social History   Socioeconomic History  . Marital status: Married    Spouse name: Not on file  . Number of children: 1  . Years of education: Not on file  . Highest education  level: Not on file  Occupational History  . Occupation: Chiropractor business   Social Needs  . Financial resource strain: Not on file  . Food insecurity:    Worry: Not on file    Inability: Not on file  . Transportation needs:    Medical: Not on file    Non-medical: Not on file  Tobacco Use  . Smoking status: Light Tobacco Smoker    Types: Cigarettes  . Smokeless tobacco: Never Used  . Tobacco comment: smokes rarely, cigars   Substance and Sexual Activity  . Alcohol use: Yes    Alcohol/week: 0.0 standard drinks    Comment: beer x 2 qd  most days  . Drug use: No  . Sexual activity: Not on file  Lifestyle  . Physical activity:    Days per week: Not on file    Minutes per session: Not on file  . Stress: Not on file  Relationships  . Social connections:    Talks on phone: Not on file    Gets together: Not on file    Attends religious service: Not on file    Active member of club or organization: Not on file    Attends meetings of clubs or organizations: Not on file    Relationship status: Not on file  . Intimate partner violence:    Fear of current or ex partner: Not on file    Emotionally abused: Not on file  Physically abused: Not on file    Forced sexual activity: Not on file  Other Topics Concern  . Not on file  Social History Narrative   Married, 1 step child , from [place]         Past Surgical History:  Procedure Laterality Date  . BUNIONECTOMY Right ~2011   has a screw  . INCISION AND DRAINAGE PERIRECTAL ABSCESS  12-2007    Family History  Problem Relation Age of Onset  . Diabetes Other        M, B x 3  . Colon cancer Neg Hx   . Prostate cancer Neg Hx   . CAD Neg Hx   . Stroke Neg Hx     Allergies  Allergen Reactions  . Codeine Sulfate Nausea Only    Current Outpatient Medications on File Prior to Visit  Medication Sig Dispense Refill  . azithromycin (ZITHROMAX Z-PAK) 250 MG tablet 2 tabs a day the first day, then 1 tab a day x 4 days 6  tablet 0  . benzonatate (TESSALON) 100 MG capsule   0  . FIBER PO Take 1 tablet by mouth daily.    . meperidine (DEMEROL) 50 MG tablet Take 50 mg by mouth every 4 (four) hours as needed for severe pain. Dispensed 25 with no refills on 05/04/17    . multivitamin (ONE-A-DAY MEN'S) TABS tablet Take 1 tablet by mouth daily.    Marland Kitchen omeprazole (PRILOSEC OTC) 20 MG tablet Take 20 mg by mouth daily.    Marland Kitchen OVER THE COUNTER MEDICATION Hydroxycut: OTC Diet Pill: 1 tablet daily.    . promethazine (PHENERGAN) 25 MG tablet Take 25 mg by mouth every 6 (six) hours as needed for nausea or vomiting. Dispensed 25 with no refills on 05/04/17    . sildenafil (VIAGRA) 100 MG tablet Take 0.5-1 tablets (50-100 mg total) by mouth daily as needed for erectile dysfunction. 10 tablet 6   No current facility-administered medications on file prior to visit.     BP 120/80   Pulse 81   Temp 98.1 F (36.7 C) (Oral)   Resp 16   Ht 5\' 5"  (1.651 m)   Wt 196 lb 12.8 oz (89.3 kg)   SpO2 99%   BMI 32.75 kg/m       Objective:   Physical Exam  General  Mental Status - Alert. General Appearance - Well groomed. Not in acute distress. Sounds nasal congested.  Skin Rashes- No Rashes.  HEENT Head- Normal. Ear Auditory Canal - Left- Normal. Right - Normal.Tympanic Membrane- Left- Normal. Right- Normal. Eye Sclera/Conjunctiva- Left- Normal. Right- Normal. Nose & Sinuses Nasal Mucosa- Left-  Boggy and Congested. Right-  Boggy and  Congested.Bilateral no maxillary and no  frontal sinus pressure. Mouth & Throat Lips: Upper Lip- Normal: no dryness, cracking, pallor, cyanosis, or vesicular eruption. Lower Lip-Normal: no dryness, cracking, pallor, cyanosis or vesicular eruption. Buccal Mucosa- Bilateral- No Aphthous ulcers. Oropharynx- No Discharge or Erythema. Tonsils: Characteristics- Bilateral- No Erythema or Congestion. Size/Enlargement- Bilateral- No enlargement. Discharge- bilateral-None.  Neck Neck- Supple. No  Masses.   Chest and Lung Exam Auscultation: Breath Sounds:-Clear even and unlabored.  Cardiovascular Auscultation:Rythm- Regular, rate and rhythm. Murmurs & Other Heart Sounds:Ausculatation of the heart reveal- No Murmurs.  Lymphatic Head & Neck General Head & Neck Lymphatics: Bilateral: Description- No Localized lymphadenopathy.       Assessment & Plan:  You do have recent bright red blood x1 day.  Also heme positive stool on testing  in the office.  No obvious external hemorrhoids but considering that you might have rectal source of bright red blood such as internal hemorrhoid or tiny fissure.  You had my chart review history of perirectal abscess and you report also fistula.  No pain presently, no fever or diarrhea.  Based on your history, I think we could give you Anusol HC Suppository to treat potential hemorrhoid source of bleed.  Stay hydrated and avoid constipation.  If you have worse bleeding, rectal pain or abdominal pain please let me know.  In that event would expand work-up and consider getting antibiotics and possibly CT imaging if you have lower abdomen region pain.  We will go ahead and refer you to gastroenterologist but at this temporarily on hold until you give [place] prior colonoscopy report and name of the MD.  We will try to get you in with the same office.  Your recent nasal congestion, sinus pressure and bronchitis symptoms seem to be improving.  Continue current medication regimen.  If of those symptoms were to worsen again please let us know.  Follow-up in 7 to 10 days or as needed.  Mackie Pai, PA-C

## 2018-02-15 NOTE — Patient Instructions (Signed)
You do have recent bright red blood x1 day.  Also heme positive stool on testing in the office.  No obvious external hemorrhoids but considering that you might have rectal source of bright red blood such as internal hemorrhoid or tiny fissure.  You had my chart review history of perirectal abscess and you report also fistula.  No pain presently, no fever or diarrhea.  Based on your history, I think we could give you Anusol HC Suppository to treat potential hemorrhoid source of bleed.  Stay hydrated and avoid constipation.  If you have worse bleeding, rectal pain or abdominal pain please let me know.  In that event would expand work-up and consider getting antibiotics and possibly CT imaging if you have lower abdomen region pain.  We will go ahead and refer you to gastroenterologist but at this temporarily on hold until you give [place] prior colonoscopy report and name of the MD.  We will try to get you in with the same office.  Your recent nasal congestion, sinus pressure and bronchitis symptoms seem to be improving.  Continue current medication regimen.  If of those symptoms were to worsen again please let us know.  Follow-up in 7 to 10 days or as needed.

## 2018-02-16 ENCOUNTER — Encounter: Payer: Self-pay | Admitting: Gastroenterology

## 2018-02-22 ENCOUNTER — Encounter: Payer: Self-pay | Admitting: Internal Medicine

## 2018-02-22 ENCOUNTER — Ambulatory Visit: Payer: Managed Care, Other (non HMO) | Admitting: Internal Medicine

## 2018-02-22 VITALS — BP 110/74 | HR 82 | Temp 98.4°F | Resp 16 | Ht 65.0 in | Wt 195.0 lb

## 2018-02-22 DIAGNOSIS — K921 Melena: Secondary | ICD-10-CM

## 2018-02-22 DIAGNOSIS — J069 Acute upper respiratory infection, unspecified: Secondary | ICD-10-CM

## 2018-02-22 NOTE — Progress Notes (Signed)
Pre visit review using our clinic review tool, if applicable. No additional management support is needed unless otherwise documented below in the visit note. 

## 2018-02-22 NOTE — Patient Instructions (Signed)
  GO TO THE FRONT DESK Schedule your next appointment for a physical in 6 months   

## 2018-02-22 NOTE — Progress Notes (Signed)
Subjective:    Patient ID: Drew Estrada, male    DOB: 1965/06/28, 52 y.o.   MRN: [id number]  DOS:  02/22/2018 Type of visit - description : f/u Interval history:  Was seen a week ago at this office by Percell Miller At the time he had respiratory symptoms and also saw bright red blood in the toilet water x1. Heme positive stool in the office. Since then, has seen a small amount of red blood per rectum x1 (3 days ago).  Also had respiratory symptoms: improved.   Review of Systems Denies fever chills No abdominal pain No hard stools or constipation No nausea, vomiting, diarrhea. No anal pain or itching.  Past Medical History:  Diagnosis Date  . H/O colonoscopy 2007    for blood in stools: neg except for int. hemorrhoids     Past Surgical History:  Procedure Laterality Date  . BUNIONECTOMY Right ~2011   has a screw  . INCISION AND DRAINAGE PERIRECTAL ABSCESS  12-2007    Social History   Socioeconomic History  . Marital status: Married    Spouse name: Not on file  . Number of children: 1  . Years of education: Not on file  . Highest education level: Not on file  Occupational History  . Occupation: Chiropractor business   Social Needs  . Financial resource strain: Not on file  . Food insecurity:    Worry: Not on file    Inability: Not on file  . Transportation needs:    Medical: Not on file    Non-medical: Not on file  Tobacco Use  . Smoking status: Light Tobacco Smoker    Types: Cigarettes  . Smokeless tobacco: Never Used  . Tobacco comment: smokes rarely, cigars   Substance and Sexual Activity  . Alcohol use: Yes    Alcohol/week: 0.0 standard drinks    Comment: beer x 2 qd  most days  . Drug use: No  . Sexual activity: Not on file  Lifestyle  . Physical activity:    Days per week: Not on file    Minutes per session: Not on file  . Stress: Not on file  Relationships  . Social connections:    Talks on phone: Not on file    Gets together: Not on file   Attends religious service: Not on file    Active member of club or organization: Not on file    Attends meetings of clubs or organizations: Not on file    Relationship status: Not on file  . Intimate partner violence:    Fear of current or ex partner: Not on file    Emotionally abused: Not on file    Physically abused: Not on file    Forced sexual activity: Not on file  Other Topics Concern  . Not on file  Social History Narrative   Married, 1 step child , from [place]           Allergies as of 02/22/2018      Reactions   Codeine Sulfate Nausea Only      Medication List        Accurate as of 02/22/18 10:49 AM. Always use your most recent med list.          azithromycin 250 MG tablet Commonly known as:  ZITHROMAX 2 tabs a day the first day, then 1 tab a day x 4 days   benzonatate 100 MG capsule Commonly known as:  TESSALON   FIBER PO Take  1 tablet by mouth daily.   hydrocortisone 25 MG suppository Commonly known as:  ANUSOL-HC Place 1 suppository (25 mg total) rectally 2 (two) times daily.   meperidine 50 MG tablet Commonly known as:  DEMEROL Take 50 mg by mouth every 4 (four) hours as needed for severe pain. Dispensed 25 with no refills on 05/04/17   multivitamin Tabs tablet Take 1 tablet by mouth daily.   omeprazole 20 MG tablet Commonly known as:  PRILOSEC OTC Take 20 mg by mouth daily.   OVER THE COUNTER MEDICATION Hydroxycut: OTC Diet Pill: 1 tablet daily.   promethazine 25 MG tablet Commonly known as:  PHENERGAN Take 25 mg by mouth every 6 (six) hours as needed for nausea or vomiting. Dispensed 25 with no refills on 05/04/17   sildenafil 100 MG tablet Commonly known as:  VIAGRA Take 0.5-1 tablets (50-100 mg total) by mouth daily as needed for erectile dysfunction.          Objective:   Physical Exam BP 110/74 (BP Location: Left Arm, Patient Position: Sitting, Cuff Size: Normal)   Pulse 82   Temp 98.4 F (36.9 C) (Oral)   Resp 16   Ht  5\' 5"  (1.651 m)   Wt 195 lb (88.5 kg)   SpO2 97%   BMI 32.45 kg/m   General:   Well developed, NAD, see BMI.  HEENT:  Normocephalic . Face symmetric, atraumatic Lungs:  CTA B Normal respiratory effort, no intercostal retractions, no accessory muscle use. Heart: RRR,  no murmur.  no pretibial edema bilaterally  Abdomen:  Not distended, soft, non-tender. No rebound or rigidity.   Skin: Not pale. Not jaundice Neurologic:  alert & oriented X3.  Speech normal, gait appropriate for age and unassisted Psych--  Cognition and judgment appear intact.  Cooperative with normal attention span and concentration.  Behavior appropriate. No anxious or depressed appearing.     Assessment & Plan:   Assessment  GERD Hemorrhoids ED (viagra prn)  PLAN Red blood per rectum: As described above, CBC was normal, he needs further evaluation and was already contacted by GI, he plans to call back and set up an appointment. URI: See last visit, symptoms resolved RTC 6 months CPX

## 2018-02-23 NOTE — Assessment & Plan Note (Signed)
Red blood per rectum: As described above, CBC was normal, he needs further evaluation and was already contacted by GI, he plans to call back and set up an appointment. URI: See last visit, symptoms resolved RTC 6 months CPX

## 2018-03-16 ENCOUNTER — Encounter: Payer: Self-pay | Admitting: Gastroenterology

## 2018-03-16 ENCOUNTER — Ambulatory Visit: Payer: Managed Care, Other (non HMO) | Admitting: Gastroenterology

## 2018-03-16 VITALS — BP 105/60 | HR 76 | Ht 65.0 in | Wt 195.0 lb

## 2018-03-16 DIAGNOSIS — K625 Hemorrhage of anus and rectum: Secondary | ICD-10-CM | POA: Diagnosis not present

## 2018-03-16 MED ORDER — PEG 3350-KCL-NA BICARB-NACL 420 G PO SOLR: 4000.0000 mL | ORAL | 0 refills | Status: DC

## 2018-03-16 NOTE — Patient Instructions (Addendum)
You will be set up for a colonoscopy for minor rectal bleeding.  You have been scheduled for a colonoscopy. Please follow written instructions given to you at your visit today.  Please pick up your prep supplies at the pharmacy within the next 1-3 days. If you use inhalers (even only as needed), please bring them with you on the day of your procedure. Your physician has requested that you go to www.startemmi.com and enter the access code given to you at your visit today. This web site gives a general overview about your procedure. However, you should still follow specific instructions given to you by our office regarding your preparation for the procedure.  Thank you for entrusting me with your care and choosing [facility].  Dr Ardis Hughs

## 2018-03-16 NOTE — Progress Notes (Signed)
HPI: This is a very pleasant 52 year old man who was referred to me by Kathlene November, MD for intermittent rectal bleeding  Chief complaint is minor rectal bleeding  About a month ago he was traveling in [place], had URI symptoms and took some OTC meds.  Saw some bright red blood per rectum on three occasions. No anal pain. No diarrhea or constipation.  Stable weight.  No FH of colon cancer.  In 2006 or so he had a colonoscopy, and it was normal.  He is not sure when it was done but he believes it was because of minor rectal bleeding just like this.  CBC earlier this month was completely normal    Review of systems: Pertinent positive and negative review of systems were noted in the above HPI section. All other review negative.   Past Medical History:  Diagnosis Date  . ED (erectile dysfunction)   . H/O colonoscopy 2007    for blood in stools: neg except for int. hemorrhoids     Past Surgical History:  Procedure Laterality Date  . BUNIONECTOMY Right ~2011   has a screw  . COLONOSCOPY    . INCISION AND DRAINAGE PERIRECTAL ABSCESS  12-2007    Current Outpatient Medications  Medication Sig Dispense Refill  . FIBER PO Take 1 tablet by mouth daily.    . multivitamin (ONE-A-DAY MEN'S) TABS tablet Take 1 tablet by mouth daily.    Marland Kitchen omeprazole (PRILOSEC OTC) 20 MG tablet Take 20 mg by mouth daily.    . sildenafil (VIAGRA) 100 MG tablet Take 0.5-1 tablets (50-100 mg total) by mouth daily as needed for erectile dysfunction. 10 tablet 6   No current facility-administered medications for this visit.     Allergies as of 03/16/2018 - Review Complete 03/16/2018  Allergen Reaction Noted  . Codeine sulfate Nausea Only 02/01/2007    Family History  Problem Relation Age of Onset  . Diabetes Mother   . Diabetes Brother        x 4  . Colon cancer Neg Hx   . Prostate cancer Neg Hx   . CAD Neg Hx   . Stroke Neg Hx     Social History   Socioeconomic History  . Marital status:  Married    Spouse name: Not on file  . Number of children: 1  . Years of education: Not on file  . Highest education level: Not on file  Occupational History  . Occupation: Chiropractor business   Social Needs  . Financial resource strain: Not on file  . Food insecurity:    Worry: Not on file    Inability: Not on file  . Transportation needs:    Medical: Not on file    Non-medical: Not on file  Tobacco Use  . Smoking status: Light Tobacco Smoker    Types: Cigars  . Smokeless tobacco: Never Used  . Tobacco comment: smokes rarely, cigars   Substance and Sexual Activity  . Alcohol use: Yes    Alcohol/week: 0.0 standard drinks    Comment: beer x 2 qd  most days  . Drug use: No  . Sexual activity: Not on file  Lifestyle  . Physical activity:    Days per week: Not on file    Minutes per session: Not on file  . Stress: Not on file  Relationships  . Social connections:    Talks on phone: Not on file    Gets together: Not on file    Attends religious  service: Not on file    Active member of club or organization: Not on file    Attends meetings of clubs or organizations: Not on file    Relationship status: Not on file  . Intimate partner violence:    Fear of current or ex partner: Not on file    Emotionally abused: Not on file    Physically abused: Not on file    Forced sexual activity: Not on file  Other Topics Concern  . Not on file  Social History Narrative   Married, 1 step child , from [place]          Physical Exam: BP 105/60   Pulse 76   Ht 5\' 5"  (1.651 m)   Wt 195 lb (88.5 kg)   BMI 32.45 kg/m  Constitutional: generally well-appearing Psychiatric: alert and oriented x3 Eyes: extraocular movements intact Mouth: oral pharynx moist, no lesions Neck: supple no lymphadenopathy Cardiovascular: heart regular rate and rhythm Lungs: clear to auscultation bilaterally Abdomen: soft, nontender, nondistended, no obvious ascites, no peritoneal signs, normal bowel  sounds Extremities: no lower extremity edema bilaterally Skin: no lesions on visible extremities Rectal exam deferred for upcoming colonoscopy  Assessment and plan: 52 y.o. male with intermittent minor rectal bleeding  I suspect this was from minor anorectal disease such as intermittent hemorrhoids.  His bleeding occurred over 2 to 3 days and has not recurred since then.  He does not have troubles with constipation or diarrhea.  I recommended colonoscopy at his soonest convenience to exclude neoplasm.    Please see the "Patient Instructions" section for addition details about the plan.   Owens Loffler, MD [facility] 03/16/2018, 10:31 AM  Cc: Mackie Pai, PA-C

## 2018-04-11 ENCOUNTER — Encounter: Payer: Self-pay | Admitting: Gastroenterology

## 2018-04-15 ENCOUNTER — Ambulatory Visit (AMBULATORY_SURGERY_CENTER): Payer: Managed Care, Other (non HMO) | Admitting: Gastroenterology

## 2018-04-15 ENCOUNTER — Other Ambulatory Visit: Payer: Self-pay

## 2018-04-15 ENCOUNTER — Encounter: Payer: Self-pay | Admitting: Gastroenterology

## 2018-04-15 VITALS — BP 98/70 | HR 73 | Temp 99.3°F | Resp 16 | Ht 65.0 in | Wt 195.0 lb

## 2018-04-15 DIAGNOSIS — D128 Benign neoplasm of rectum: Secondary | ICD-10-CM

## 2018-04-15 DIAGNOSIS — D120 Benign neoplasm of cecum: Secondary | ICD-10-CM | POA: Diagnosis not present

## 2018-04-15 DIAGNOSIS — K649 Unspecified hemorrhoids: Secondary | ICD-10-CM | POA: Diagnosis not present

## 2018-04-15 DIAGNOSIS — K625 Hemorrhage of anus and rectum: Secondary | ICD-10-CM

## 2018-04-15 MED ORDER — SODIUM CHLORIDE 0.9 % IV SOLN: 500.0000 mL | Freq: Once | INTRAVENOUS | Status: DC

## 2018-04-15 NOTE — Progress Notes (Signed)
Report to PACU, RN, vss, BBS= Clear.  

## 2018-04-15 NOTE — Patient Instructions (Signed)
YOU HAD AN ENDOSCOPIC PROCEDURE TODAY AT THE [facility]:   Refer to the procedure report that was given to you for any specific questions about what was found during the examination.  If the procedure report does not answer your questions, please call your gastroenterologist to clarify.  If you requested that your care partner not be given the details of your procedure findings, then the procedure report has been included in a sealed envelope for you to review at your convenience later.  YOU SHOULD EXPECT: Some feelings of bloating in the abdomen. Passage of more gas than usual.  Walking can help get rid of the air that was put into your GI tract during the procedure and reduce the bloating. If you had a lower endoscopy (such as a colonoscopy or flexible sigmoidoscopy) you may notice spotting of blood in your stool or on the toilet paper. If you underwent a bowel prep for your procedure, you may not have a normal bowel movement for a few days.  Please Note:  You might notice some irritation and congestion in your nose or some drainage.  This is from the oxygen used during your procedure.  There is no need for concern and it should clear up in a day or so.  SYMPTOMS TO REPORT IMMEDIATELY:   Following lower endoscopy (colonoscopy or flexible sigmoidoscopy):  Excessive amounts of blood in the stool  Significant tenderness or worsening of abdominal pains  Swelling of the abdomen that is new, acute  Fever of 100F or higher  For urgent or emergent issues, a gastroenterologist can be reached at any hour by calling [phone].   DIET:  We do recommend a small meal at first, but then you may proceed to your regular diet.  Drink plenty of fluids but you should avoid alcoholic beverages for 24 hours.  ACTIVITY:  You should plan to take it easy for the rest of today and you should NOT DRIVE or use heavy machinery until tomorrow (because of the sedation medicines used during the test).     FOLLOW UP: Our staff will call the number listed on your records the next business day following your procedure to check on you and address any questions or concerns that you may have regarding the information given to you following your procedure. If we do not reach you, we will leave a message.  However, if you are feeling well and you are not experiencing any problems, there is no need to return our call.  We will assume that you have returned to your regular daily activities without incident.  If any biopsies were taken you will be contacted by phone or by letter within the next 1-3 weeks.  Please call us at [phone] if you have not heard about the biopsies in 3 weeks.    SIGNATURES/CONFIDENTIALITY: You and/or your care partner have signed paperwork which will be entered into your electronic medical record.  These signatures attest to the fact that that the information above on your After Visit Summary has been reviewed and is understood.  Full responsibility of the confidentiality of this discharge information lies with you and/or your care-partner. 

## 2018-04-15 NOTE — Op Note (Signed)
[facility] Patient Name: Drew Estrada Procedure Date: 04/15/2018 2:50 PM MRN: [id number] Endoscopist: Milus Banister , MD Age: 52 Referring MD:  Date of Birth: 26-Jul-1965 Gender: Male Account #: [id number] Procedure:                Colonoscopy Indications:              Rectal bleeding Medicines:                Monitored Anesthesia Care Procedure:                Pre-Anesthesia Assessment:                           - Prior to the procedure, a History and Physical                            was performed, and patient medications and                            allergies were reviewed. The patient's tolerance of                            previous anesthesia was also reviewed. The risks                            and benefits of the procedure and the sedation                            options and risks were discussed with the patient.                            All questions were answered, and informed consent                            was obtained. Prior Anticoagulants: The patient has                            taken no previous anticoagulant or antiplatelet                            agents. ASA Grade Assessment: II - A patient with                            mild systemic disease. After reviewing the risks                            and benefits, the patient was deemed in                            satisfactory condition to undergo the procedure.                           After obtaining informed consent, the colonoscope  was passed under direct vision. Throughout the                            procedure, the patient's blood pressure, pulse, and                            oxygen saturations were monitored continuously. The                            Colonoscope was introduced through the anus and                            advanced to the the cecum, identified by                            appendiceal orifice and ileocecal valve. The                    colonoscopy was performed without difficulty. The                            patient tolerated the procedure well. The quality                            of the bowel preparation was good. The ileocecal                            valve, appendiceal orifice, and rectum were                            photographed. Scope In: 2:54:41 PM Scope Out: 3:07:56 PM Scope Withdrawal Time: 0 hours 11 minutes 54 seconds  Total Procedure Duration: 0 hours 13 minutes 15 seconds  Findings:                 Two sessile polyps were found in the rectum and                            cecum. The polyps were 2 to 4 mm in size. These                            polyps were removed with a cold snare. Resection                            was complete, but the polyp tissue was only                            partially retrieved.                           Internal hemorrhoids were found. The hemorrhoids                            were small.  The exam was otherwise without abnormality on                            direct and retroflexion views. Complications:            No immediate complications. Estimated blood loss:                            None. Estimated Blood Loss:     Estimated blood loss: none. Impression:               - Two 2 to 4 mm polyps in the rectum and in the                            cecum, removed with a cold snare. Complete                            resection. Partial retrieval.                           - Internal hemorrhoids.                           - The examination was otherwise normal on direct                            and retroflexion views. Recommendation:           - Patient has a contact number available for                            emergencies. The signs and symptoms of potential                            delayed complications were discussed with the                            patient. Return to normal activities tomorrow.                             Written discharge instructions were provided to the                            patient.                           - Resume previous diet.                           - Continue present medications.                           You will receive a letter within 2-3 weeks with the                            pathology results and my final recommendations.  If the polyp(s) is proven to be 'pre-cancerous' on                            pathology, you will need repeat colonoscopy in 5                            years. If the polyp(s) is NOT 'precancerous' on                            pathology then you should repeat colon cancer                            screening in 10 years with colonoscopy without need                            for colon cancer screening by any method prior to                            then (including stool testing). Milus Banister, MD 04/15/2018 3:16:27 PM This report has been signed electronically.

## 2018-04-15 NOTE — Progress Notes (Signed)
Called to room to assist during endoscopic procedure.  Patient ID and intended procedure confirmed with present staff. Received instructions for my participation in the procedure from the performing physician.  

## 2018-04-18 ENCOUNTER — Telehealth: Payer: Self-pay | Admitting: *Deleted

## 2018-04-18 NOTE — Telephone Encounter (Signed)
First follow up call attempt.  Reached voicemail with number identifier.  Message left to call if any questions or concerns.

## 2018-04-18 NOTE — Telephone Encounter (Signed)
  Follow up Call-  Call back number 04/15/2018  Post procedure Call Back phone  # [phone]  Permission to leave phone message Yes  Some recent data might be hidden     Patient questions:  Do you have a fever, pain , or abdominal swelling? No. Pain Score  0 *  Have you tolerated food without any problems? Yes.    Have you been able to return to your normal activities? Yes.    Do you have any questions about your discharge instructions: Diet   No. Medications  No. Follow up visit  No.  Do you have questions or concerns about your Care? No.  Actions: * If pain score is 4 or above: No action needed, pain <4.

## 2018-04-26 ENCOUNTER — Encounter: Payer: Self-pay | Admitting: Gastroenterology

## 2018-05-02 ENCOUNTER — Ambulatory Visit: Payer: Managed Care, Other (non HMO) | Admitting: Internal Medicine

## 2018-05-02 ENCOUNTER — Encounter: Payer: Self-pay | Admitting: Internal Medicine

## 2018-05-02 VITALS — BP 116/64 | HR 61 | Temp 98.0°F | Resp 16 | Ht 65.0 in | Wt 194.2 lb

## 2018-05-02 DIAGNOSIS — K219 Gastro-esophageal reflux disease without esophagitis: Secondary | ICD-10-CM | POA: Diagnosis not present

## 2018-05-02 DIAGNOSIS — N529 Male erectile dysfunction, unspecified: Secondary | ICD-10-CM | POA: Diagnosis not present

## 2018-05-02 DIAGNOSIS — K648 Other hemorrhoids: Secondary | ICD-10-CM | POA: Diagnosis not present

## 2018-05-02 MED ORDER — SILDENAFIL CITRATE 100 MG PO TABS: 50.0000 mg | ORAL_TABLET | Freq: Every day | ORAL | 6 refills | Status: DC | PRN

## 2018-05-02 NOTE — Assessment & Plan Note (Signed)
GERD: Controlled ED: Request a refill on sildenafil, prefers 100 mg tablets, prescription sent. Red blood per rectum, h/o  hemorrhoids: Had a colonoscopy 04/15/2018, 2 polyps, hemorrhoids presence confirmed, next in 5 years Had a flu shot RTC CPX 08/2018

## 2018-05-02 NOTE — Patient Instructions (Signed)
  GO TO THE FRONT DESK Schedule your next appointment for a  Physical exam by April or may 2020

## 2018-05-02 NOTE — Progress Notes (Signed)
Subjective:    Patient ID: Drew Estrada, male    DOB: 03-24-1966, 53 y.o.   MRN: [id number]  DOS:  05/02/2018 Type of visit - description: Here for refills ED: Sildenafil works, needs a refill Recent colonoscopy report reviewed GERD: Essentially no symptoms    Review of Systems Since the last visit and after he had a colonoscopy, he saw a small amount of blood per rectum x 1 otherwise asymptomatic Denies chest pain no difficulty breathing Takes ibuprofen only sporadically  Past Medical History:  Diagnosis Date  . ED (erectile dysfunction)   . GERD (gastroesophageal reflux disease)   . H/O colonoscopy 2007    for blood in stools: neg except for int. hemorrhoids     Past Surgical History:  Procedure Laterality Date  . BUNIONECTOMY Right ~2011   has a screw  . COLONOSCOPY    . INCISION AND DRAINAGE PERIRECTAL ABSCESS  12-2007    Social History   Socioeconomic History  . Marital status: Married    Spouse name: Not on file  . Number of children: 1  . Years of education: Not on file  . Highest education level: Not on file  Occupational History  . Occupation: Chiropractor business   Social Needs  . Financial resource strain: Not on file  . Food insecurity:    Worry: Not on file    Inability: Not on file  . Transportation needs:    Medical: Not on file    Non-medical: Not on file  Tobacco Use  . Smoking status: Light Tobacco Smoker    Types: Cigars  . Smokeless tobacco: Never Used  . Tobacco comment: smokes rarely, cigars   Substance and Sexual Activity  . Alcohol use: Yes    Alcohol/week: 0.0 standard drinks    Comment: beer x 2 qd  most days  . Drug use: No  . Sexual activity: Not on file  Lifestyle  . Physical activity:    Days per week: Not on file    Minutes per session: Not on file  . Stress: Not on file  Relationships  . Social connections:    Talks on phone: Not on file    Gets together: Not on file    Attends religious service: Not on file   Active member of club or organization: Not on file    Attends meetings of clubs or organizations: Not on file    Relationship status: Not on file  . Intimate partner violence:    Fear of current or ex partner: Not on file    Emotionally abused: Not on file    Physically abused: Not on file    Forced sexual activity: Not on file  Other Topics Concern  . Not on file  Social History Narrative   Married, 1 step child , from [place]           Allergies as of 05/02/2018      Reactions   Codeine Sulfate Nausea Only      Medication List       Accurate as of May 02, 2018  8:16 PM. Always use your most recent med list.        FIBER PO Take 1 tablet by mouth daily.   ibuprofen 800 MG tablet Commonly known as:  ADVIL,MOTRIN   multivitamin Tabs tablet Take 1 tablet by mouth daily.   omeprazole 20 MG tablet Commonly known as:  PRILOSEC OTC Take 20 mg by mouth daily.   sildenafil 100  MG tablet Commonly known as:  VIAGRA Take 0.5-1 tablets (50-100 mg total) by mouth daily as needed for erectile dysfunction.           Objective:   Physical Exam BP 116/64 (BP Location: Left Arm, Patient Position: Sitting, Cuff Size: Small)   Pulse 61   Temp 98 F (36.7 C) (Oral)   Resp 16   Ht 5\' 5"  (1.651 m)   Wt 194 lb 4 oz (88.1 kg)   SpO2 98%   BMI 32.32 kg/m  General:   Well developed, NAD, BMI noted. HEENT:  Normocephalic . Face symmetric, atraumatic Lungs:  CTA B Normal respiratory effort, no intercostal retractions, no accessory muscle use. Heart: RRR,  no murmur.  No pretibial edema bilaterally  Skin: Not pale. Not jaundice Neurologic:  alert & oriented X3.  Speech normal, gait appropriate for age and unassisted Psych--  Cognition and judgment appear intact.  Cooperative with normal attention span and concentration.  Behavior appropriate. No anxious or depressed appearing.      Assessment     Assessment  GERD Hemorrhoids ED (viagra prn)  PLAN GERD:  Controlled ED: Request a refill on sildenafil, prefers 100 mg tablets, prescription sent. Red blood per rectum, h/o  hemorrhoids: Had a colonoscopy 04/15/2018, 2 polyps, hemorrhoids presence confirmed, next in 5 years Had a flu shot RTC CPX 08/2018

## 2018-05-02 NOTE — Progress Notes (Signed)
Pre visit review using our clinic review tool, if applicable. No additional management support is needed unless otherwise documented below in the visit note. 

## 2018-09-06 ENCOUNTER — Encounter: Payer: Managed Care, Other (non HMO) | Admitting: Internal Medicine

## 2018-11-14 ENCOUNTER — Other Ambulatory Visit: Payer: Self-pay

## 2018-11-16 ENCOUNTER — Encounter: Payer: Self-pay | Admitting: Internal Medicine

## 2018-11-16 ENCOUNTER — Other Ambulatory Visit: Payer: Self-pay

## 2018-11-16 ENCOUNTER — Ambulatory Visit: Payer: Managed Care, Other (non HMO) | Admitting: Internal Medicine

## 2018-11-16 VITALS — BP 121/81 | HR 74 | Temp 98.4°F | Resp 16 | Ht 65.0 in | Wt 200.4 lb

## 2018-11-16 DIAGNOSIS — M25561 Pain in right knee: Secondary | ICD-10-CM | POA: Diagnosis not present

## 2018-11-16 DIAGNOSIS — M25562 Pain in left knee: Secondary | ICD-10-CM | POA: Diagnosis not present

## 2018-11-16 MED ORDER — DICLOFENAC SODIUM 2 % TD SOLN: 1.0000 "application " | Freq: Two times a day (BID) | TRANSDERMAL | 1 refills | Status: DC

## 2018-11-16 NOTE — Patient Instructions (Addendum)
Stretch twice a day  Braces  pennsaid  twice a day  Physical therapy referral  Call if not gradually better        Patellar Tendinitis  Patellar tendinitis is also called jumper's knee or patellar tendinopathy. This condition happens when there is damage to and inflammation of the patellar tendon. Tendons are cord-like tissues that connect muscles to bones. The patellar tendon connects the bottom of the kneecap (patella) to the top of the shin bone (tibia). Patellar tendinitis causes pain in the front of the knee. The condition happens in the following stages:  Stage 1: In this stage, you have pain only after activity.  Stage 2: In this stage, you have pain during and after activity.  Stage 3: In this stage, you have pain at rest as well as during and after activity. The pain limits your ability to do the activity.  Stage 4: In this stage, the tendon tears and severely limits your activity. What are the causes? This condition is caused by repeated (repetitive) stress on the tendon. This stress may cause the tendon to stretch, swell, thicken, or tear. What increases the risk? The following factors may make you more likely to develop this condition:  Participating in sports that involve running, kicking, and jumping, especially on hard surfaces. These include: ? Basketball. ? Volleyball. ? Soccer. ? Track and field.  Training too hard.  Having tight thigh muscles.  Having received steroid injections in the tendon.  Having had knee surgery.  Being 4-35 years old.  Having rheumatoid arthritis, diabetes, or kidney disease. These conditions interrupt blood flow to the knee, causing the tendon to weaken. What are the signs or symptoms? The main symptom of this condition is pain and swelling in the front of the knee. The pain usually starts slowly and gradually gets worse. It may become painful to straighten your leg. The pain may get worse when you walk, run, or jump.  How is this diagnosed? This condition may be diagnosed based on:  Your symptoms.  Your medical history.  A physical exam. During the physical exam, your health care provider may check for: ? Tenderness in your patella. ? Tightness in your thigh muscles. ? Pain when you straighten your knee.  Imaging tests, including: ? X-rays. These will show the position and condition of your patella. ? An MRI. This will show any abnormality of the tendon. ? Ultrasound. This will show any swelling or other abnormalities of the tendon. How is this treated? Treatment for this condition depends on the stage of the condition. It may involve:  Avoiding activities that cause pain, such as jumping.  Icing and elevating your knee.  Having sound wave stimulation to promote healing.  Doing stretching and strengthening exercises (physical therapy) when pain and swelling improve.  Wearing a knee brace. This may be needed if your condition does not improve with treatment.  Using crutches or a walker. This may be needed if your condition does not improve with treatment.  Surgery. This may be done if you have stage 4 tendinitis. Follow these instructions at home: If you have a brace:  Wear the brace as told by your health care provider. Remove it only as told by your health care provider.  Loosen the brace if your toes tingle, become numb, or turn cold and blue.  Keep the brace clean.  If the brace is not waterproof: ? Do not let it get wet. ? Cover it with a watertight covering when you  take a bath or shower.  Ask your health care provider when it is safe for you to drive. Managing pain, stiffness, and swelling   If directed, put ice on the injured area. ? If you have a removable brace, remove it as told by your health care provider. ? Put ice in a plastic bag. ? Place a towel between your skin and the bag. ? Leave the ice on for 20 minutes, 2-3 times a day.  Move your toes often to reduce  stiffness and swelling.  Raise (elevate) your knee above the level of your heart while you are sitting or lying down. Activity  Do not use the injured limb to support your body weight until your health care provider says that you can. Use your crutches or a walker as told by your health care provider.  Return to your normal activities as told by your health care provider. Ask your health care provider what activities are safe for you.  Do exercises as told by your health care provider or physical therapist. General instructions  Take over-the-counter and prescription medicines only as told by your health care provider.  Do not use any products that contain nicotine or tobacco, such as cigarettes, e-cigarettes, and chewing tobacco. These can delay healing. If you need help quitting, ask your health care provider.  Keep all follow-up visits as told by your health care provider. This is important. How is this prevented?  Warm up and stretch before being active.  Cool down and stretch after being active.  Give your body time to rest between periods of activity. ? You may need to reduce how often you play a sport that requires frequent jumping.  Make sure to use equipment that fits you.  Be safe and responsible while being active. This will help you avoid falls which can damage the tendon.  Do at least 150 minutes of moderate-intensity exercise each week, such as brisk walking or water aerobics.  Maintain physical fitness, including: ? Strength. ? Flexibility. ? Cardiovascular fitness. ? Endurance. Contact a health care provider if:  Your symptoms have not improved in 6 weeks.  Your symptoms get worse. Summary  Patellar tendinitis is also called jumper's knee or patellar tendinopathy. This condition happens when there is damage to and inflammation of the patellar tendon.  Treatment for this condition depends on the stage of the condition and may include rest, ice, exercises,  medicines, and surgery.  Do not use the injured limb to support your body weight until your health care provider says that you can.  Take over-the-counter and prescription medicines only as told by your health care provider.  Keep all follow-up visits as told by your health care provider. This is important. This information is not intended to replace advice given to you by your health care provider. Make sure you discuss any questions you have with your health care provider. Document Released: 04/13/2005 Document Revised: 08/04/2018 Document Reviewed: 03/07/2018 Elsevier Patient Education  2020 [employer].

## 2018-11-16 NOTE — Progress Notes (Signed)
Subjective:    Patient ID: Drew Estrada, male    DOB: 07/10/1965, 53 y.o.   MRN: [id number]  DOS:  11/16/2018 Type of visit - description: acute  Symptoms started 6 weeks ago with bilateral, frontal knee pain. 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 Denies any been swollen or red. Pain is somewhat decreased using braces.   Review of Systems No acute injury or fall No frequent kneeling  Past Medical History:  Diagnosis Date  . ED (erectile dysfunction)   . GERD (gastroesophageal reflux disease)   . H/O colonoscopy 2007    for blood in stools: neg except for int. hemorrhoids     Past Surgical History:  Procedure Laterality Date  . BUNIONECTOMY Right ~2011   has a screw  . COLONOSCOPY    . INCISION AND DRAINAGE PERIRECTAL ABSCESS  12-2007    Social History   Socioeconomic History  . Marital status: Married    Spouse name: Not on file  . Number of children: 1  . Years of education: Not on file  . Highest education level: Not on file  Occupational History  . Occupation: Chiropractor business   Social Needs  . Financial resource strain: Not on file  . Food insecurity    Worry: Not on file    Inability: Not on file  . Transportation needs    Medical: Not on file    Non-medical: Not on file  Tobacco Use  . Smoking status: Light Tobacco Smoker    Types: Cigars  . Smokeless tobacco: Never Used  . Tobacco comment: smokes rarely, cigars   Substance and Sexual Activity  . Alcohol use: Yes    Alcohol/week: 0.0 standard drinks    Comment: beer x 2 qd  most days  . Drug use: No  . Sexual activity: Not on file  Lifestyle  . Physical activity    Days per week: Not on file    Minutes per session: Not on file  . Stress: Not on file  Relationships  . Social Herbalist on phone: Not on file    Gets together: Not on file    Attends religious service: Not on file    Active member of club or organization: Not on file   Attends meetings of clubs or organizations: Not on file    Relationship status: Not on file  . Intimate partner violence    Fear of current or ex partner: Not on file    Emotionally abused: Not on file    Physically abused: Not on file    Forced sexual activity: Not on file  Other Topics Concern  . Not on file  Social History Narrative   Married, 1 step child , from [place]           Allergies as of 11/16/2018      Reactions   Codeine Sulfate Nausea Only      Medication List       Accurate as of November 16, 2018 11:59 PM. If you have any questions, ask your nurse or doctor.        Diclofenac Sodium 2 % Soln Place 1 application onto the skin 2 (two) times a day. Started by: Kathlene November, MD   FIBER PO Take 1 tablet by mouth daily.   ibuprofen 800 MG tablet Commonly known as: ADVIL   multivitamin Tabs tablet Take 1 tablet by mouth daily.  omeprazole 20 MG tablet Commonly known as: PRILOSEC OTC Take 20 mg by mouth daily.   sildenafil 100 MG tablet Commonly known as: VIAGRA Take 0.5-1 tablets (50-100 mg total) by mouth daily as needed for erectile dysfunction.           Objective:   Physical Exam BP 121/81 (BP Location: Left Arm, Patient Position: Sitting, Cuff Size: Small)   Pulse 74   Temp 98.4 F (36.9 C) (Oral)   Resp 16   Ht 5\' 5"  (1.651 m)   Wt 200 lb 6 oz (90.9 kg)   SpO2 94%   BMI 33.34 kg/m  General:   Well developed, NAD, BMI noted. HEENT:  Normocephalic . Face symmetric, atraumatic MSK: Knees symmetric. Range of motion normal, no effusion, no redness or warmness. Patellar tendon distally slightly TTP Skin: Not pale. Not jaundice Neurologic:  alert & oriented X3.  Speech normal, gait appropriate for age and unassisted Psych--  Cognition and judgment appear intact.  Cooperative with normal attention span and concentration.  Behavior appropriate. No anxious or depressed appearing.      Assessment     Assessment  GERD Hemorrhoids  ED (viagra prn)  PLAN Patellar tendinitis: Suspect symptoms related to bilateral patella tendinitis, recommend stretching, referred to PT.  Also ice at night, braces, topical NSAIDs.  Call if not gradually better.

## 2018-11-16 NOTE — Progress Notes (Signed)
Pre visit review using our clinic review tool, if applicable. No additional management support is needed unless otherwise documented below in the visit note. 

## 2018-11-17 ENCOUNTER — Telehealth: Payer: Self-pay

## 2018-11-17 NOTE — Assessment & Plan Note (Signed)
Patellar tendinitis: Suspect symptoms related to bilateral patella tendinitis, recommend stretching, referred to PT.  Also ice at night, braces, topical NSAIDs.  Call if not gradually better.

## 2018-11-17 NOTE — Telephone Encounter (Signed)
PA initiated via Covermymeds; KEY: [id number]. Awaiting determination.

## 2018-11-22 MED ORDER — DICLOFENAC SODIUM 1 % TD GEL: 2.0000 g | Freq: Four times a day (QID) | TRANSDERMAL | 1 refills | Status: DC | PRN

## 2018-11-22 NOTE — Telephone Encounter (Signed)
PA denied. Preferred alternatives: diclofenac 1% gel, celecoxib, etodolac, fenoprofen, flurbiprofen, ibuprofen, indomethacin, ketoprofen, meclofenamate, meloxicam, nabumetone, naproxen, piroxicam, and sulindac.

## 2018-11-22 NOTE — Telephone Encounter (Signed)
New prescription sent

## 2019-01-25 ENCOUNTER — Other Ambulatory Visit: Payer: Self-pay

## 2019-01-26 ENCOUNTER — Encounter: Payer: Self-pay | Admitting: Internal Medicine

## 2019-01-26 ENCOUNTER — Other Ambulatory Visit: Payer: Self-pay

## 2019-01-26 ENCOUNTER — Ambulatory Visit (INDEPENDENT_AMBULATORY_CARE_PROVIDER_SITE_OTHER): Payer: Managed Care, Other (non HMO) | Admitting: Internal Medicine

## 2019-01-26 VITALS — BP 122/66 | HR 80 | Temp 97.6°F | Resp 16 | Ht 65.0 in | Wt 197.5 lb

## 2019-01-26 DIAGNOSIS — Z125 Encounter for screening for malignant neoplasm of prostate: Secondary | ICD-10-CM

## 2019-01-26 DIAGNOSIS — K219 Gastro-esophageal reflux disease without esophagitis: Secondary | ICD-10-CM

## 2019-01-26 DIAGNOSIS — Z833 Family history of diabetes mellitus: Secondary | ICD-10-CM

## 2019-01-26 DIAGNOSIS — Z0000 Encounter for general adult medical examination without abnormal findings: Secondary | ICD-10-CM

## 2019-01-26 DIAGNOSIS — E781 Pure hyperglyceridemia: Secondary | ICD-10-CM

## 2019-01-26 LAB — COMPREHENSIVE METABOLIC PANEL
ALT: 64 U/L — ABNORMAL HIGH (ref 0–53)
AST: 38 U/L — ABNORMAL HIGH (ref 0–37)
Albumin: 4.3 g/dL (ref 3.5–5.2)
Alkaline Phosphatase: 103 U/L (ref 39–117)
BUN: 16 mg/dL (ref 6–23)
CO2: 25 mEq/L (ref 19–32)
Calcium: 9.4 mg/dL (ref 8.4–10.5)
Chloride: 104 mEq/L (ref 96–112)
Creatinine, Ser: 0.92 mg/dL (ref 0.40–1.50)
GFR: 86.81 mL/min (ref >60.00)
Glucose, Bld: 111 mg/dL — ABNORMAL HIGH (ref 70–99)
Potassium: 4 mEq/L (ref 3.5–5.1)
Sodium: 139 mEq/L (ref 135–145)
Total Bilirubin: 0.6 mg/dL (ref 0.2–1.2)
Total Protein: 7 g/dL (ref 6.0–8.3)

## 2019-01-26 LAB — LIPID PANEL
Cholesterol: 167 mg/dL (ref 0–200)
HDL: 42.5 mg/dL (ref >39.00)
Total CHOL/HDL Ratio: 4
Triglycerides: 439 mg/dL — ABNORMAL HIGH (ref 0.0–149.0)

## 2019-01-26 LAB — HEMOGLOBIN A1C: Hgb A1c MFr Bld: 5.5 % (ref 4.6–6.5)

## 2019-01-26 LAB — CBC WITH DIFFERENTIAL/PLATELET
Basophils Absolute: 0 10*3/uL (ref 0.0–0.1)
Basophils Relative: 0.4 % (ref 0.0–3.0)
Eosinophils Absolute: 0.1 10*3/uL (ref 0.0–0.7)
Eosinophils Relative: 2.3 % (ref 0.0–5.0)
HCT: 46.3 % (ref 39.0–52.0)
Hemoglobin: 15.8 g/dL (ref 13.0–17.0)
Lymphocytes Relative: 39.3 % (ref 12.0–46.0)
Lymphs Abs: 2.3 10*3/uL (ref 0.7–4.0)
MCHC: 34.1 g/dL (ref 30.0–36.0)
MCV: 91.9 fl (ref 78.0–100.0)
Monocytes Absolute: 0.5 10*3/uL (ref 0.1–1.0)
Monocytes Relative: 7.9 % (ref 3.0–12.0)
Neutro Abs: 3 10*3/uL (ref 1.4–7.7)
Neutrophils Relative %: 50.1 % (ref 43.0–77.0)
Platelets: 182 10*3/uL (ref 150.0–400.0)
RBC: 5.04 Mil/uL (ref 4.22–5.81)
RDW: 12.7 % (ref 11.5–15.5)
WBC: 5.9 10*3/uL (ref 4.0–10.5)

## 2019-01-26 LAB — PSA: PSA: 2.3 ng/mL (ref 0.10–4.00)

## 2019-01-26 LAB — TSH: TSH: 1.21 u[IU]/mL (ref 0.35–4.50)

## 2019-01-26 LAB — LDL CHOLESTEROL, DIRECT: Direct LDL: 96 mg/dL

## 2019-01-26 NOTE — Progress Notes (Signed)
Subjective:    Patient ID: Drew Estrada, male    DOB: Sep 11, 1965, 53 y.o.   MRN: [id number]  DOS:  01/26/2019 Type of visit - description: cpx In general feeling well. He the loss of brother few weeks ago to lung cancer. Also reports on/off trigger finger, second and third fingers L>R.  Symptoms decreased with NSAIDs, ice.  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  Review of Systems  Other than above, a 14 point review of systems is negative     Past Medical History:  Diagnosis Date  . ED (erectile dysfunction)   . GERD (gastroesophageal reflux disease)   . H/O colonoscopy 2007    for blood in stools: neg except for int. hemorrhoids     Past Surgical History:  Procedure Laterality Date  . BUNIONECTOMY Right ~2011   has a screw  . COLONOSCOPY    . INCISION AND DRAINAGE PERIRECTAL ABSCESS  12-2007    Social History   Socioeconomic History  . Marital status: Married    Spouse name: Not on file  . Number of children: 1  . Years of education: Not on file  . Highest education level: Not on file  Occupational History  . Occupation: Chiropractor business   Social Needs  . Financial resource strain: Not on file  . Food insecurity    Worry: Not on file    Inability: Not on file  . Transportation needs    Medical: Not on file    Non-medical: Not on file  Tobacco Use  . Smoking status: Light Tobacco Smoker    Types: Cigars  . Smokeless tobacco: Never Used  . Tobacco comment: smokes rarely, cigars   Substance and Sexual Activity  . Alcohol use: Yes    Alcohol/week: 0.0 standard drinks    Comment: beer x 2 qd  most days  . Drug use: No  . Sexual activity: Not on file  Lifestyle  . Physical activity    Days per week: Not on file    Minutes per session: Not on file  . Stress: Not on file  Relationships  . Social Herbalist on phone: Not on file    Gets together: Not on file    Attends religious service: Not on file    Active member of club  or organization: Not on file    Attends meetings of clubs or organizations: Not on file    Relationship status: Not on file  . Intimate partner violence    Fear of current or ex partner: Not on file    Emotionally abused: Not on file    Physically abused: Not on file    Forced sexual activity: Not on file  Other Topics Concern  . Not on file  Social History Narrative   Married, 1 step child , from [place]          Family History  Problem Relation Age of Onset  . Diabetes Mother   . Diabetes Brother        35 of 11 brothers have DM  . Lung cancer Brother 56  . Colon cancer Neg Hx   . Prostate cancer Neg Hx   . CAD Neg Hx   . Stroke Neg Hx   . Esophageal cancer Neg Hx      Allergies as of 01/26/2019      Reactions   Codeine Sulfate Nausea Only      Medication List  Accurate as of January 26, 2019 11:59 PM. If you have any questions, ask your nurse or doctor.        diclofenac sodium 1 % Gel Commonly known as: VOLTAREN Apply 2 g topically 4 (four) times daily as needed.   FIBER PO Take 1 tablet by mouth daily.   ibuprofen 800 MG tablet Commonly known as: ADVIL   multivitamin Tabs tablet Take 1 tablet by mouth daily.   omeprazole 20 MG tablet Commonly known as: PRILOSEC OTC Take 20 mg by mouth daily.   sildenafil 100 MG tablet Commonly known as: VIAGRA Take 0.5-1 tablets (50-100 mg total) by mouth daily as needed for erectile dysfunction.           Objective:   Physical Exam BP 122/66 (BP Location: Left Arm, Patient Position: Sitting, Cuff Size: Normal)   Pulse 80   Temp 97.6 F (36.4 C) (Temporal)   Resp 16   Ht 5\' 5"  (1.651 m)   Wt 197 lb 8 oz (89.6 kg)   SpO2 99%   BMI 32.87 kg/m  General: Well developed, NAD, BMI noted Neck: No  thyromegaly  HEENT:  Normocephalic . Face symmetric, atraumatic Lungs:  CTA B Normal respiratory effort, no intercostal retractions, no accessory muscle use. Heart: RRR,  no murmur.  No pretibial  edema bilaterally  Abdomen:  Not distended, soft, non-tender. No rebound or rigidity.   Skin: Exposed areas without rash. Not pale. Not jaundice DRE: Normal sphincter tone, normal prostate, no stools found Neurologic:  alert & oriented X3.  Speech normal, gait appropriate for age and unassisted Strength symmetric and appropriate for age. MSK: No trigger finger today Psych: Cognition and judgment appear intact.  Cooperative with normal attention span and concentration.  Behavior appropriate. No anxious or depressed appearing.     Assessment     Assessment  GERD Hemorrhoids ED (viagra prn) Strong FH DM  (7 of 11 brothers)  PLAN Here for CPX GERD: Controlled EKG today NSR + FH DM: Checking A1c Trigger finger: On and off symptoms, if they become more persistent or debilitating he will let me know RTC 1 year

## 2019-01-26 NOTE — Assessment & Plan Note (Addendum)
-  Td 04-2016 -had a Flu shot - CCS: Had a  Cscope in 2007 for blood in stools; again 03/2018, polyps, next per GI -Prostate cancer screening: DRE negative, check a PSA -Diet and exercise discussed. -Labs: CMP, FLP, CBC,A1c, TSH,PSA

## 2019-01-26 NOTE — Progress Notes (Signed)
Pre visit review using our clinic review tool, if applicable. No additional management support is needed unless otherwise documented below in the visit note. 

## 2019-01-26 NOTE — Patient Instructions (Addendum)
GO TO THE LAB : Get the blood work     GO TO THE FRONT DESK Schedule your next appointment   for physical exam in 1 year

## 2019-01-27 NOTE — Assessment & Plan Note (Signed)
Here for CPX GERD: Controlled EKG today NSR + FH DM: Checking A1c Trigger finger: On and off symptoms, if they become more persistent or debilitating he will let me know RTC 1 year

## 2019-02-01 NOTE — Addendum Note (Signed)
Addended by: Damita Dunnings D on: 02/01/2019 11:25 AM   Modules accepted: Orders

## 2019-02-23 ENCOUNTER — Ambulatory Visit: Payer: Managed Care, Other (non HMO) | Admitting: Internal Medicine

## 2019-02-23 ENCOUNTER — Encounter: Payer: Self-pay | Admitting: Internal Medicine

## 2019-02-23 ENCOUNTER — Other Ambulatory Visit: Payer: Self-pay

## 2019-02-23 VITALS — BP 115/82 | HR 72 | Temp 96.9°F | Resp 16 | Ht 65.0 in | Wt 199.5 lb

## 2019-02-23 DIAGNOSIS — K648 Other hemorrhoids: Secondary | ICD-10-CM | POA: Diagnosis not present

## 2019-02-23 DIAGNOSIS — Z09 Encounter for follow-up examination after completed treatment for conditions other than malignant neoplasm: Secondary | ICD-10-CM

## 2019-02-23 MED ORDER — HYDROCORTISONE ACETATE 25 MG RE SUPP: 25.0000 mg | Freq: Two times a day (BID) | RECTAL | 1 refills | Status: DC | PRN

## 2019-02-23 NOTE — Progress Notes (Signed)
Pre visit review using our clinic review tool, if applicable. No additional management support is needed unless otherwise documented below in the visit note. 

## 2019-02-23 NOTE — Progress Notes (Signed)
Subjective:    Patient ID: Drew Estrada, male    DOB: 16-Aug-1965, 53 y.o.   MRN: [id number]  DOS:  02/23/2019 Type of visit - description: 3 days ago  had 2 episodes of red blood per rectum. Symptoms since then resolved. Bowel movements are normal. Color of the stools normal.  Review of Systems Fever chills No nausea or vomiting No abdominal pain No rectal pain or itching  Past Medical History:  Diagnosis Date  . ED (erectile dysfunction)   . GERD (gastroesophageal reflux disease)   . H/O colonoscopy 2007    for blood in stools: neg except for int. hemorrhoids     Past Surgical History:  Procedure Laterality Date  . BUNIONECTOMY Right ~2011   has a screw  . COLONOSCOPY    . INCISION AND DRAINAGE PERIRECTAL ABSCESS  12-2007    Social History   Socioeconomic History  . Marital status: Married    Spouse name: Not on file  . Number of children: 1  . Years of education: Not on file  . Highest education level: Not on file  Occupational History  . Occupation: Chiropractor business   Social Needs  . Financial resource strain: Not on file  . Food insecurity    Worry: Not on file    Inability: Not on file  . Transportation needs    Medical: Not on file    Non-medical: Not on file  Tobacco Use  . Smoking status: Light Tobacco Smoker    Types: Cigars  . Smokeless tobacco: Never Used  . Tobacco comment: smokes rarely, cigars   Substance and Sexual Activity  . Alcohol use: Yes    Alcohol/week: 0.0 standard drinks    Comment: beer x 2 qd  most days  . Drug use: No  . Sexual activity: Not on file  Lifestyle  . Physical activity    Days per week: Not on file    Minutes per session: Not on file  . Stress: Not on file  Relationships  . Social Herbalist on phone: Not on file    Gets together: Not on file    Attends religious service: Not on file    Active member of club or organization: Not on file    Attends meetings of clubs or organizations: Not on  file    Relationship status: Not on file  . Intimate partner violence    Fear of current or ex partner: Not on file    Emotionally abused: Not on file    Physically abused: Not on file    Forced sexual activity: Not on file  Other Topics Concern  . Not on file  Social History Narrative   Married, 1 step child , from [place]           Allergies as of 02/23/2019      Reactions   Codeine Sulfate Nausea Only      Medication List       Accurate as of February 23, 2019 10:50 AM. If you have any questions, ask your nurse or doctor.        diclofenac sodium 1 % Gel Commonly known as: VOLTAREN Apply 2 g topically 4 (four) times daily as needed.   FIBER PO Take 1 tablet by mouth daily.   ibuprofen 800 MG tablet Commonly known as: ADVIL   multivitamin Tabs tablet Take 1 tablet by mouth daily.   omeprazole 20 MG tablet Commonly known as: PRILOSEC OTC  Take 20 mg by mouth daily.   sildenafil 100 MG tablet Commonly known as: VIAGRA Take 0.5-1 tablets (50-100 mg total) by mouth daily as needed for erectile dysfunction.           Objective:   Physical Exam BP 115/82 (BP Location: Left Arm, Patient Position: Sitting, Cuff Size: Small)   Pulse 72   Temp (!) 96.9 F (36.1 C) (Temporal)   Resp 16   Ht 5\' 5"  (1.651 m)   Wt 199 lb 8 oz (90.5 kg)   SpO2 98%   BMI 33.20 kg/m  General:   Well developed, NAD, BMI noted. HEENT:  Normocephalic . Face symmetric, atraumatic Abdomen: Soft, nontender Neurologic:  alert & oriented X3.  Speech normal, gait appropriate for age and unassisted Psych--  Cognition and judgment appear intact.  Cooperative with normal attention span and concentration.  Behavior appropriate. No anxious or depressed appearing.      Assessment      Assessment  GERD Hemorrhoids.  Colonoscopy 03/2018, 2 polyps, + internal hemorrhoids ED (viagra prn) Strong FH DM  (7 of 11 brothers)  PLAN Hemorrhoidal bleed: Patient had 2 episodes of red  blood per rectum 3 days ago, no pain or itching, no abdominal symptoms.  Most likely this is hemorrhoidal bleed.  Chart reviewed Saw GI 03/16/2018 for rectal bleeding, felt to be hemorrhoidal.  Colonoscopy done 03/2018, 2 polyps, + internal hemorrhoids Since symptoms are typical we agreed not to do an anoscopy today, will treat with Anusol suppositories as needed, will call if symptoms worse.

## 2019-02-25 NOTE — Assessment & Plan Note (Signed)
Hemorrhoidal bleed: Patient had 2 episodes of red blood per rectum 3 days ago, no pain or itching, no abdominal symptoms.  Most likely this is hemorrhoidal bleed.  Chart reviewed Saw GI 03/16/2018 for rectal bleeding, felt to be hemorrhoidal.  Colonoscopy done 03/2018, 2 polyps, + internal hemorrhoids Since symptoms are typical we agreed not to do an anoscopy today, will treat with Anusol suppositories as needed, will call if symptoms worse.

## 2019-03-03 ENCOUNTER — Other Ambulatory Visit: Payer: Self-pay | Admitting: Internal Medicine

## 2019-06-18 ENCOUNTER — Encounter: Payer: Self-pay | Admitting: Internal Medicine

## 2020-01-30 ENCOUNTER — Encounter: Payer: Managed Care, Other (non HMO) | Admitting: Internal Medicine

## 2020-02-12 ENCOUNTER — Encounter: Payer: Managed Care, Other (non HMO) | Admitting: Internal Medicine

## 2020-03-12 ENCOUNTER — Ambulatory Visit (INDEPENDENT_AMBULATORY_CARE_PROVIDER_SITE_OTHER): Payer: Managed Care, Other (non HMO) | Admitting: Internal Medicine

## 2020-03-12 ENCOUNTER — Other Ambulatory Visit: Payer: Self-pay

## 2020-03-12 ENCOUNTER — Encounter: Payer: Self-pay | Admitting: Internal Medicine

## 2020-03-12 VITALS — BP 105/72 | HR 73 | Temp 98.0°F | Resp 16 | Ht 65.0 in | Wt 201.0 lb

## 2020-03-12 DIAGNOSIS — Z0000 Encounter for general adult medical examination without abnormal findings: Secondary | ICD-10-CM | POA: Diagnosis not present

## 2020-03-12 DIAGNOSIS — E781 Pure hyperglyceridemia: Secondary | ICD-10-CM

## 2020-03-12 DIAGNOSIS — K648 Other hemorrhoids: Secondary | ICD-10-CM | POA: Diagnosis not present

## 2020-03-12 DIAGNOSIS — Z1159 Encounter for screening for other viral diseases: Secondary | ICD-10-CM | POA: Diagnosis not present

## 2020-03-12 DIAGNOSIS — R7989 Other specified abnormal findings of blood chemistry: Secondary | ICD-10-CM

## 2020-03-12 MED ORDER — SILDENAFIL CITRATE 100 MG PO TABS: ORAL_TABLET | ORAL | 6 refills | Status: DC

## 2020-03-12 MED ORDER — HYDROCORTISONE ACETATE 25 MG RE SUPP: 25.0000 mg | Freq: Two times a day (BID) | RECTAL | 1 refills | Status: DC | PRN

## 2020-03-12 MED ORDER — DICLOFENAC SODIUM 1 % EX GEL: 4.0000 g | Freq: Four times a day (QID) | CUTANEOUS | 5 refills | Status: AC

## 2020-03-12 NOTE — Progress Notes (Signed)
Subjective:    Patient ID: Drew Estrada, male    DOB: [birth year], 54 y.o.   MRN: [id number]  DOS:  03/12/2020 Type of visit - description: CPX  In general feeling well. Still sees occasional blood with bowel movements, typically drops on the toilet paper.  No associated pain. Also has noted a trigger finger at the right index.     Review of Systems  Other than above, a 14 point review of systems is negative    Past Medical History:  Diagnosis Date  . ED (erectile dysfunction)   . GERD (gastroesophageal reflux disease)   . H/O colonoscopy 2007    for blood in stools: neg except for int. hemorrhoids     Past Surgical History:  Procedure Laterality Date  . BUNIONECTOMY Right ~2011   has a screw  . COLONOSCOPY    . INCISION AND DRAINAGE PERIRECTAL ABSCESS  12-2007    Allergies as of 03/12/2020      Reactions   Codeine Sulfate Nausea Only      Medication List       Accurate as of March 12, 2020 11:59 PM. If you have any questions, ask your nurse or doctor.        STOP taking these medications   diclofenac sodium 1 % Gel Commonly known as: VOLTAREN Replaced by: diclofenac Sodium 1 % Gel Stopped by: Kathlene November, MD     TAKE these medications   diclofenac Sodium 1 % Gel Commonly known as: VOLTAREN Apply 4 g topically 4 (four) times daily. Replaces: diclofenac sodium 1 % Gel Started by: Kathlene November, MD   FIBER PO Take 1 tablet by mouth daily.   hydrocortisone 25 MG suppository Commonly known as: ANUSOL-HC Place 1 suppository (25 mg total) rectally 2 (two) times daily as needed for hemorrhoids.   ibuprofen 800 MG tablet Commonly known as: ADVIL   multivitamin Tabs tablet Take 1 tablet by mouth daily.   omeprazole 20 MG tablet Commonly known as: PRILOSEC OTC Take 20 mg by mouth daily.   sildenafil 100 MG tablet Commonly known as: VIAGRA TAKE 1/2-1 TABLETS BY MOUTH DAILY AS NEEDED FOR ERECTILE DYSFUNCTION.          Objective:   Physical  Exam BP 105/72 (BP Location: Right Arm, Patient Position: Sitting, Cuff Size: Normal)   Pulse 73   Temp 98 F (36.7 C) (Oral)   Resp 16   Ht 5\' 5"  (1.651 m)   Wt 201 lb (91.2 kg)   SpO2 94%   BMI 33.45 kg/m  General: Well developed, NAD, BMI noted Neck: No  thyromegaly  HEENT:  Normocephalic . Face symmetric, atraumatic Lungs:  CTA B Normal respiratory effort, no intercostal retractions, no accessory muscle use. Heart: RRR,  no murmur.  Abdomen:  Not distended, soft, non-tender. No rebound or rigidity.   Lower extremities: no pretibial edema bilaterally MSK: Hands with no synovitis, minimal trigger finger, right index. Skin: Exposed areas without rash. Not pale. Not jaundice Neurologic:  alert & oriented X3.  Speech normal, gait appropriate for age and unassisted Strength symmetric and appropriate for age.  Psych: Cognition and judgment appear intact.  Cooperative with normal attention span and concentration.  Behavior appropriate. No anxious or depressed appearing.     Assessment     Assessment  GERD Hemorrhoids.  Colonoscopy 03/2018, 2 polyps, + internal hemorrhoids ED (viagra prn) Strong FH DM  (7 of 11 brothers)  PLAN Here for CPX GERD: Well-controlled on PPIs Hemorrhoids:  Still has occasional bleeding.  We agreed on observation, if bleeding increases in frequency or intensity he will let me know.  GI referral?. ED: RF Viagra Trigger finger: R index, rec topical NSAIDs, call if symptoms severe. RTC 1 year  This visit occurred during the SARS-CoV-2 public health emergency.  Safety protocols were in place, including screening questions prior to the visit, additional usage of staff PPE, and extensive cleaning of exam room while observing appropriate contact time as indicated for disinfecting solutions.

## 2020-03-12 NOTE — Progress Notes (Signed)
Pre visit review using our clinic review tool, if applicable. No additional management support is needed unless otherwise documented below in the visit note. 

## 2020-03-12 NOTE — Patient Instructions (Addendum)
   GO TO THE LAB : Get the blood work     GO TO THE FRONT DESK, PLEASE SCHEDULE YOUR APPOINTMENTS Come back for a physical exam in 1 year 

## 2020-03-13 ENCOUNTER — Encounter: Payer: Self-pay | Admitting: Internal Medicine

## 2020-03-13 LAB — CBC WITH DIFFERENTIAL/PLATELET
Basophils Absolute: 0.1 10*3/uL (ref 0.0–0.1)
Basophils Relative: 1 % (ref 0.0–3.0)
Eosinophils Absolute: 0.1 10*3/uL (ref 0.0–0.7)
Eosinophils Relative: 1.8 % (ref 0.0–5.0)
HCT: 45.9 % (ref 39.0–52.0)
Hemoglobin: 15.7 g/dL (ref 13.0–17.0)
Lymphocytes Relative: 39.4 % (ref 12.0–46.0)
Lymphs Abs: 2.5 10*3/uL (ref 0.7–4.0)
MCHC: 34.3 g/dL (ref 30.0–36.0)
MCV: 90.9 fl (ref 78.0–100.0)
Monocytes Absolute: 0.5 10*3/uL (ref 0.1–1.0)
Monocytes Relative: 7.6 % (ref 3.0–12.0)
Neutro Abs: 3.2 10*3/uL (ref 1.4–7.7)
Neutrophils Relative %: 50.2 % (ref 43.0–77.0)
Platelets: 165 10*3/uL (ref 150.0–400.0)
RBC: 5.06 Mil/uL (ref 4.22–5.81)
RDW: 12.4 % (ref 11.5–15.5)
WBC: 6.3 10*3/uL (ref 4.0–10.5)

## 2020-03-13 LAB — COMPREHENSIVE METABOLIC PANEL
ALT: 72 U/L — ABNORMAL HIGH (ref 0–53)
AST: 42 U/L — ABNORMAL HIGH (ref 0–37)
Albumin: 4.2 g/dL (ref 3.5–5.2)
Alkaline Phosphatase: 92 U/L (ref 39–117)
BUN: 16 mg/dL (ref 6–23)
CO2: 26 mEq/L (ref 19–32)
Calcium: 9.3 mg/dL (ref 8.4–10.5)
Chloride: 106 mEq/L (ref 96–112)
Creatinine, Ser: 0.94 mg/dL (ref 0.40–1.50)
GFR: 93.6 mL/min (ref >60.00)
Glucose, Bld: 121 mg/dL — ABNORMAL HIGH (ref 70–99)
Potassium: 3.9 mEq/L (ref 3.5–5.1)
Sodium: 138 mEq/L (ref 135–145)
Total Bilirubin: 0.7 mg/dL (ref 0.2–1.2)
Total Protein: 6.9 g/dL (ref 6.0–8.3)

## 2020-03-13 LAB — LIPID PANEL
Cholesterol: 154 mg/dL (ref 0–200)
HDL: 43.5 mg/dL (ref >39.00)
NonHDL: 110.92
Total CHOL/HDL Ratio: 4
Triglycerides: 219 mg/dL — ABNORMAL HIGH (ref 0.0–149.0)
VLDL: 43.8 mg/dL — ABNORMAL HIGH (ref 0.0–40.0)

## 2020-03-13 LAB — HEPATITIS C ANTIBODY
Hepatitis C Ab: NONREACTIVE
SIGNAL TO CUT-OFF: 0.01 (ref <1.00)

## 2020-03-13 LAB — LDL CHOLESTEROL, DIRECT: Direct LDL: 96 mg/dL

## 2020-03-13 LAB — HEMOGLOBIN A1C: Hgb A1c MFr Bld: 5.3 % (ref 4.6–6.5)

## 2020-03-13 NOTE — Assessment & Plan Note (Signed)
-  Td1-2018 - covid vax x 2, plans to get a booster  -had aFlu shot - CCS: Had aCscope in 2007 for blood in stools; again 03/2018, polyps, next per GI -Prostate cancer screening: DRE and PSA within normal 2020. - etoh: Drinks typically 3 or 4 servings a day, advised that more than 2 servings daily is deleterious to his health. - casual smoker, counseled, sees a dentist regularly  -Diet: Extensively discussed, moderation- portion control is important.  Exercise encouraged -Labs: CMP, FLP, CBC, A1c, hep C

## 2020-03-13 NOTE — Assessment & Plan Note (Signed)
Here for CPX GERD: Well-controlled on PPIs Hemorrhoids: Still has occasional bleeding.  We agreed on observation, if bleeding increases in frequency or intensity he will let me know.  GI referral?. ED: RF Viagra Trigger finger: R index, rec topical NSAIDs, call if symptoms severe. RTC 1 year

## 2020-03-18 NOTE — Addendum Note (Signed)
Addended byDamita Dunnings D on: 03/18/2020 08:31 AM   Modules accepted: Orders

## 2020-03-26 ENCOUNTER — Ambulatory Visit (HOSPITAL_BASED_OUTPATIENT_CLINIC_OR_DEPARTMENT_OTHER)
Admission: RE | Admit: 2020-03-26 | Discharge: 2020-03-26 | Disposition: A | Discharge status: Home / Self Care | Payer: Managed Care, Other (non HMO) | Source: Ambulatory Visit | Attending: Internal Medicine | Admitting: Internal Medicine

## 2020-03-26 ENCOUNTER — Other Ambulatory Visit: Payer: Self-pay

## 2020-03-26 ENCOUNTER — Telehealth: Payer: Self-pay

## 2020-03-26 DIAGNOSIS — R7989 Other specified abnormal findings of blood chemistry: Secondary | ICD-10-CM | POA: Diagnosis not present

## 2020-03-26 NOTE — Telephone Encounter (Signed)
Received call from [facility]iology- reporting below.   IMPRESSION: 1. Echogenic liver compatible with hepatic steatosis. 2. Gallstones and gallbladder wall thickening. No pericholecystic fluid or sonographic Murphy's sign. Correlate for any clinical signs or symptoms of cholecystitis. 3. Nonobstructing left renal calculi. 4. These results will be called to the ordering clinician or representative by the Radiologist Assistant, and communication documented in the PACS or [employer]

## 2020-03-26 NOTE — Telephone Encounter (Signed)
Called patient, no answer, unable to leave message.  Will send a message.

## 2020-10-08 ENCOUNTER — Ambulatory Visit: Payer: Managed Care, Other (non HMO) | Admitting: Internal Medicine

## 2020-10-08 ENCOUNTER — Encounter: Payer: Self-pay | Admitting: Internal Medicine

## 2020-10-09 ENCOUNTER — Encounter: Payer: Self-pay | Admitting: Internal Medicine

## 2020-10-09 ENCOUNTER — Other Ambulatory Visit: Payer: Self-pay

## 2020-10-09 ENCOUNTER — Ambulatory Visit: Payer: Managed Care, Other (non HMO) | Admitting: Internal Medicine

## 2020-10-09 VITALS — BP 118/80 | HR 93 | Temp 98.3°F | Resp 16 | Ht 65.0 in | Wt 198.5 lb

## 2020-10-09 DIAGNOSIS — R7989 Other specified abnormal findings of blood chemistry: Secondary | ICD-10-CM | POA: Diagnosis not present

## 2020-10-09 DIAGNOSIS — K760 Fatty (change of) liver, not elsewhere classified: Secondary | ICD-10-CM | POA: Diagnosis not present

## 2020-10-09 DIAGNOSIS — K8020 Calculus of gallbladder without cholecystitis without obstruction: Secondary | ICD-10-CM | POA: Diagnosis not present

## 2020-10-09 NOTE — Patient Instructions (Signed)
IBUPROFEN (Advil or Motrin) 200 mg 1 or 2 tablets at night as needed for pain.  Always take it with food because may cause gastritis and ulcers.  If you notice nausea, stomach pain, change in the color of stools --->  Stop the medicine and let us know  ICE at night    GO TO THE LAB : Get the blood work

## 2020-10-09 NOTE — Progress Notes (Signed)
Subjective:    Patient ID: Drew Estrada, male    DOB: 02-24-1966, 55 y.o.   MRN: [id number]  DOS:  10/09/2020 Type of visit - description: Acute  2 weeks history of pain at the lateral aspect of the left hip. Increased when he walks.  Decreases when he sits down. Some radiation to the side of the thigh.  Denies any fall or injury.  No rash  Will also talk about increased LFTs.  Review of Systems Denies nausea vomiting or diarrhea.  No abdominal pain, no blood in the urine.  Past Medical History:  Diagnosis Date   ED (erectile dysfunction)    GERD (gastroesophageal reflux disease)    H/O colonoscopy 2007    for blood in stools: neg except for int. hemorrhoids     Past Surgical History:  Procedure Laterality Date   BUNIONECTOMY Right ~2011   has a screw   COLONOSCOPY     INCISION AND DRAINAGE PERIRECTAL ABSCESS  12-2007    Allergies as of 10/09/2020       Reactions   Codeine Sulfate Nausea Only        Medication List        Accurate as of October 09, 2020  3:46 PM. If you have any questions, ask your nurse or doctor.          diclofenac Sodium 1 % Gel Commonly known as: VOLTAREN Apply 4 g topically 4 (four) times daily.   FIBER PO Take 1 tablet by mouth daily.   hydrocortisone 25 MG suppository Commonly known as: ANUSOL-HC Place 1 suppository (25 mg total) rectally 2 (two) times daily as needed for hemorrhoids.   ibuprofen 800 MG tablet Commonly known as: ADVIL   multivitamin Tabs tablet Take 1 tablet by mouth daily.   omeprazole 20 MG tablet Commonly known as: PRILOSEC OTC Take 20 mg by mouth daily.   sildenafil 100 MG tablet Commonly known as: VIAGRA TAKE 1/2-1 TABLETS BY MOUTH DAILY AS NEEDED FOR ERECTILE DYSFUNCTION.           Objective:   Physical Exam Musculoskeletal:       Legs:   BP 118/80 (BP Location: Left Arm, Patient Position: Sitting, Cuff Size: Normal)   Pulse 93   Temp 98.3 F (36.8 C) (Oral)   Resp 16   Ht 5\' 5"   (1.651 m)   Wt 198 lb 8 oz (90 kg)   SpO2 98%   BMI 33.03 kg/m  General:   Well developed, NAD, BMI noted.  HEENT:  Normocephalic . Face symmetric, atraumatic Lungs:  CTA B Normal respiratory effort, no intercostal retractions, no accessory muscle use. Heart: RRR,  no murmur.  Abdomen:  Not distended, soft, non-tender. No rebound or rigidity.   Skin: Not pale. Not jaundice MSK: No TTP at the trochanteric bursa, rotation of hips normal.   Lower extremities: no pretibial edema bilaterally  Neurologic:  alert & oriented X3.  Speech normal, gait appropriate for age and unassisted Psych--  Cognition and judgment appear intact.  Cooperative with normal attention span and concentration.  Behavior appropriate. No anxious or depressed appearing.     Assessment       Assessment  GERD Hemorrhoids.  Colonoscopy 03/2018, 2 polyps, + internal hemorrhoids ED (viagra prn) Strong FH DM  (7 of 11 brothers) [place] 02-2020: Fatty liver, GB stones   PLAN L hip pain: As described above, suspect pain is from the hip, no obvious trochanteric bursitis,no  radiculopathy.  Denies back  pain.  Recommend small amount of ibuprofen with GI precautions, ice, call if not better Fatty liver (per [place]), increased LFTs: Admits to excessive EtOH, last time he had heavy etoh use was 3 days ago . Recent hep C negative, check LFTs and hep B serology.  The patient is determined to decrease alcohol consumption  Cholelithiasis: per [place], if sxs he will  let me know   This visit occurred during the SARS-CoV-2 public health emergency.  Safety protocols were in place, including screening questions prior to the visit, additional usage of staff PPE, and extensive cleaning of exam room while observing appropriate contact time as indicated for disinfecting solutions.

## 2020-10-10 DIAGNOSIS — K8020 Calculus of gallbladder without cholecystitis without obstruction: Secondary | ICD-10-CM | POA: Insufficient documentation

## 2020-10-10 DIAGNOSIS — K760 Fatty (change of) liver, not elsewhere classified: Secondary | ICD-10-CM | POA: Insufficient documentation

## 2020-10-10 LAB — HEPATIC FUNCTION PANEL
ALT: 70 U/L — ABNORMAL HIGH (ref 0–53)
AST: 40 U/L — ABNORMAL HIGH (ref 0–37)
Albumin: 4.4 g/dL (ref 3.5–5.2)
Alkaline Phosphatase: 99 U/L (ref 39–117)
Bilirubin, Direct: 0.1 mg/dL (ref 0.0–0.3)
Total Bilirubin: 0.4 mg/dL (ref 0.2–1.2)
Total Protein: 7.3 g/dL (ref 6.0–8.3)

## 2020-10-10 LAB — HEPATITIS B SURFACE ANTIGEN: Hepatitis B Surface Ag: NONREACTIVE

## 2020-10-10 LAB — HEPATITIS B CORE ANTIBODY, TOTAL: Hep B Core Total Ab: NONREACTIVE

## 2020-10-10 NOTE — Assessment & Plan Note (Signed)
L hip pain: As described above, suspect pain is from the hip, no obvious trochanteric bursitis,no  radiculopathy.  Denies back pain.  Recommend small amount of ibuprofen with GI precautions, ice, call if not better Fatty liver (per [place]), increased LFTs: Admits to excessive EtOH, last time he had heavy etoh use was 3 days ago . Recent hep C negative, check LFTs and hep B serology.  The patient is determined to decrease alcohol consumption  Cholelithiasis: per [place], if sxs he will  let me know

## 2021-03-18 ENCOUNTER — Other Ambulatory Visit: Payer: Self-pay

## 2021-03-18 ENCOUNTER — Encounter: Payer: Self-pay | Admitting: Internal Medicine

## 2021-03-18 ENCOUNTER — Ambulatory Visit (INDEPENDENT_AMBULATORY_CARE_PROVIDER_SITE_OTHER): Payer: Managed Care, Other (non HMO) | Admitting: Internal Medicine

## 2021-03-18 VITALS — BP 116/84 | HR 74 | Temp 98.2°F | Resp 16 | Ht 65.0 in | Wt 201.0 lb

## 2021-03-18 DIAGNOSIS — Z0000 Encounter for general adult medical examination without abnormal findings: Secondary | ICD-10-CM | POA: Diagnosis not present

## 2021-03-18 DIAGNOSIS — E781 Pure hyperglyceridemia: Secondary | ICD-10-CM

## 2021-03-18 DIAGNOSIS — R7989 Other specified abnormal findings of blood chemistry: Secondary | ICD-10-CM | POA: Diagnosis not present

## 2021-03-18 NOTE — Progress Notes (Signed)
Subjective:    Patient ID: Drew Estrada, male    DOB: 1965/12/26, 55 y.o.   MRN: [id number]  DOS:  03/18/2021 Type of visit - description: cpx  Here for CPX. 2 weeks ago had some sore throat and cough, he feels much better now.     Review of Systems  Other than above, a 14 point review of systems is negative     Past Medical History:  Diagnosis Date   ED (erectile dysfunction)    GERD (gastroesophageal reflux disease)    H/O colonoscopy 2007    for blood in stools: neg except for int. hemorrhoids     Past Surgical History:  Procedure Laterality Date   BUNIONECTOMY Right ~2011   has a screw   COLONOSCOPY     INCISION AND DRAINAGE PERIRECTAL ABSCESS  12-2007   Social History   Socioeconomic History   Marital status: Married    Spouse name: Not on file   Number of children: 1   Years of education: Not on file   Highest education level: Not on file  Occupational History   Occupation: restaurant business   Tobacco Use   Smoking status: Light Smoker    Types: Cigars   Smokeless tobacco: Never   Tobacco comments:    smokes rarely, cigars   Vaping Use   Vaping Use: Never used  Substance and Sexual Activity   Alcohol use: Yes    Comment: 2 beers qhs   Drug use: No   Sexual activity: Not on file  Other Topics Concern   Not on file  Social History Narrative   Household: pt and wife    1 step child ,    from [place]        Social Determinants of Health   Financial Resource Strain: Not on file  Food Insecurity: Not on file  Transportation Needs: Not on file  Physical Activity: Not on file  Stress: Not on file  Social Connections: Not on file  Intimate Partner Violence: Not on file    Allergies as of 03/18/2021       Reactions   Codeine Sulfate Nausea Only        Medication List        Accurate as of March 18, 2021  5:39 PM. If you have any questions, ask your nurse or doctor.          diclofenac Sodium 1 % Gel Commonly known  as: VOLTAREN Apply 4 g topically 4 (four) times daily.   FIBER PO Take 1 tablet by mouth daily.   hydrocortisone 25 MG suppository Commonly known as: ANUSOL-HC Place 1 suppository (25 mg total) rectally 2 (two) times daily as needed for hemorrhoids.   ibuprofen 800 MG tablet Commonly known as: ADVIL   multivitamin Tabs tablet Take 1 tablet by mouth daily.   omeprazole 20 MG tablet Commonly known as: PRILOSEC OTC Take 20 mg by mouth daily.   sildenafil 100 MG tablet Commonly known as: VIAGRA TAKE 1/2-1 TABLETS BY MOUTH DAILY AS NEEDED FOR ERECTILE DYSFUNCTION.           Objective:   Physical Exam BP 116/84 (BP Location: Left Arm, Patient Position: Sitting, Cuff Size: Normal)   Pulse 74   Temp 98.2 F (36.8 C) (Oral)   Resp 16   Ht 5\' 5"  (1.651 m)   Wt 201 lb (91.2 kg)   SpO2 97%   BMI 33.45 kg/m  General: Well developed, NAD, BMI noted  Neck: No  thyromegaly  HEENT:  Normocephalic . Face symmetric, atraumatic Lungs:  CTA B Normal respiratory effort, no intercostal retractions, no accessory muscle use. Heart: RRR,  no murmur.  Abdomen:  Not distended, soft, non-tender. No rebound or rigidity.   Lower extremities: no pretibial edema bilaterally DRE: Normal sphincter tone, no stools, prostate normal Skin: Exposed areas without rash. Not pale. Not jaundice Neurologic:  alert & oriented X3.  Speech normal, gait appropriate for age and unassisted Strength symmetric and appropriate for age.  Psych: Cognition and judgment appear intact.  Cooperative with normal attention span and concentration.  Behavior appropriate. No anxious or depressed appearing.     Assessment    Assessment  GERD Hemorrhoids.  Colonoscopy 03/2018, 2 polyps, + internal hemorrhoids.  ED (viagra prn) Strong FH DM  (7 of 11 brothers) Increased LFTs: -[place] 02-2020: Fatty liver, GB stones  -Hep B, hep C: Negative   PLAN Here for CPX GERD: Well-controlled ED: On Viagra, no  change Elevated LFTs: Probably related to alcohol, for completeness we will check iron  studies, SPEP, alpha-1 antitrypsin. RTC 1 year depending on results     This visit occurred during the SARS-CoV-2 public health emergency.  Safety protocols were in place, including screening questions prior to the visit, additional usage of staff PPE, and extensive cleaning of exam room while observing appropriate contact time as indicated for disinfecting solutions.

## 2021-03-18 NOTE — Patient Instructions (Signed)
Think about the shingles shot    GO TO THE LAB : Get the blood work     GO TO THE FRONT DESK, [place] Come back for a physical exam in 1 year

## 2021-03-18 NOTE — Assessment & Plan Note (Signed)
Assessment  GERD Hemorrhoids.  Colonoscopy 03/2018, 2 polyps, + internal hemorrhoids.  ED (viagra prn) Strong FH DM  (7 of 11 brothers) Increased LFTs: -[place] 02-2020: Fatty liver, GB stones  -Hep B, hep C: Negative   PLAN Here for CPX GERD: Well-controlled ED: On Viagra, no change Elevated LFTs: Probably related to alcohol, for completeness we will check iron  studies, SPEP, alpha-1 antitrypsin. RTC 1 year depending on results

## 2021-03-18 NOTE — Assessment & Plan Note (Signed)
-  Td 04-2016 -Shingrix discussed - covid vax: Up-to-date -had a Flu shot - CCS: Had a  Cscope in 2007 for blood in stools; again 03/2018, polyps,  Next 2024 per GI letter -Prostate cancer screening: DRE normal, check PSA - etoh: 2 drinks at night, recommend to avoid more than that. - casual smoker, counseled -Diet: Discussed.  Exercise discussed. -labs `CMP FLP CBC TSH PSA, iron studies, SPEP, alpha-1 antitrypsin,

## 2021-03-19 LAB — COMPREHENSIVE METABOLIC PANEL
ALT: 76 U/L — ABNORMAL HIGH (ref 0–53)
AST: 39 U/L — ABNORMAL HIGH (ref 0–37)
Albumin: 4.4 g/dL (ref 3.5–5.2)
Alkaline Phosphatase: 88 U/L (ref 39–117)
BUN: 15 mg/dL (ref 6–23)
CO2: 27 mEq/L (ref 19–32)
Calcium: 9.4 mg/dL (ref 8.4–10.5)
Chloride: 104 mEq/L (ref 96–112)
Creatinine, Ser: 0.97 mg/dL (ref 0.40–1.50)
GFR: 89.5 mL/min (ref >60.00)
Glucose, Bld: 100 mg/dL — ABNORMAL HIGH (ref 70–99)
Potassium: 4 mEq/L (ref 3.5–5.1)
Sodium: 138 mEq/L (ref 135–145)
Total Bilirubin: 0.4 mg/dL (ref 0.2–1.2)
Total Protein: 7.1 g/dL (ref 6.0–8.3)

## 2021-03-19 LAB — FERRITIN: Ferritin: 156.4 ng/mL (ref 22.0–322.0)

## 2021-03-19 LAB — CBC WITH DIFFERENTIAL/PLATELET
Basophils Absolute: 0 10*3/uL (ref 0.0–0.1)
Basophils Relative: 0.7 % (ref 0.0–3.0)
Eosinophils Absolute: 0.1 10*3/uL (ref 0.0–0.7)
Eosinophils Relative: 1.8 % (ref 0.0–5.0)
HCT: 45.9 % (ref 39.0–52.0)
Hemoglobin: 15.5 g/dL (ref 13.0–17.0)
Lymphocytes Relative: 32.6 % (ref 12.0–46.0)
Lymphs Abs: 2.2 10*3/uL (ref 0.7–4.0)
MCHC: 33.8 g/dL (ref 30.0–36.0)
MCV: 92 fl (ref 78.0–100.0)
Monocytes Absolute: 0.5 10*3/uL (ref 0.1–1.0)
Monocytes Relative: 6.9 % (ref 3.0–12.0)
Neutro Abs: 3.9 10*3/uL (ref 1.4–7.7)
Neutrophils Relative %: 58 % (ref 43.0–77.0)
Platelets: 180 10*3/uL (ref 150.0–400.0)
RBC: 4.99 Mil/uL (ref 4.22–5.81)
RDW: 12.7 % (ref 11.5–15.5)
WBC: 6.7 10*3/uL (ref 4.0–10.5)

## 2021-03-19 LAB — PSA: PSA: 1.64 ng/mL (ref 0.10–4.00)

## 2021-03-19 LAB — LIPID PANEL
Cholesterol: 150 mg/dL (ref 0–200)
HDL: 40.1 mg/dL (ref >39.00)
NonHDL: 110.19
Total CHOL/HDL Ratio: 4
Triglycerides: 335 mg/dL — ABNORMAL HIGH (ref 0.0–149.0)
VLDL: 67 mg/dL — ABNORMAL HIGH (ref 0.0–40.0)

## 2021-03-19 LAB — TSH: TSH: 0.95 u[IU]/mL (ref 0.35–5.50)

## 2021-03-19 LAB — IRON: Iron: 176 ug/dL — ABNORMAL HIGH (ref 42–165)

## 2021-03-19 LAB — LDL CHOLESTEROL, DIRECT: Direct LDL: 74 mg/dL

## 2021-03-21 LAB — PROTEIN ELECTROPHORESIS, SERUM
Albumin ELP: 4.1 g/dL (ref 3.8–4.8)
Alpha 1: 0.2 g/dL (ref 0.2–0.3)
Alpha 2: 0.5 g/dL (ref 0.5–0.9)
Beta 2: 0.4 g/dL (ref 0.2–0.5)
Beta Globulin: 0.4 g/dL (ref 0.4–0.6)
Gamma Globulin: 1.3 g/dL (ref 0.8–1.7)
Total Protein: 6.9 g/dL (ref 6.1–8.1)

## 2021-03-21 LAB — ALPHA-1-ANTITRYPSIN: A-1 Antitrypsin, Ser: 138 mg/dL (ref 83–199)

## 2021-03-24 LAB — TRANSFERRIN SATURATION
IRON SATN MFR SERPL: 41 % Saturation
IRON SERPL-MCNC: 156 ug/dL
TRANSFERRIN SERPL-MCNC: 271 mg/dL

## 2021-03-27 NOTE — Addendum Note (Signed)
Addended byDamita Dunnings D on: 03/27/2021 08:52 AM   Modules accepted: Orders

## 2021-06-05 ENCOUNTER — Other Ambulatory Visit: Payer: Self-pay

## 2021-06-05 DIAGNOSIS — R7989 Other specified abnormal findings of blood chemistry: Secondary | ICD-10-CM

## 2021-06-19 ENCOUNTER — Encounter: Payer: Self-pay | Admitting: Internal Medicine

## 2021-06-26 ENCOUNTER — Encounter: Payer: Self-pay | Admitting: Gastroenterology

## 2021-07-29 ENCOUNTER — Ambulatory Visit: Payer: Managed Care, Other (non HMO) | Admitting: Gastroenterology

## 2021-07-29 ENCOUNTER — Encounter: Payer: Self-pay | Admitting: Gastroenterology

## 2021-07-29 ENCOUNTER — Other Ambulatory Visit (INDEPENDENT_AMBULATORY_CARE_PROVIDER_SITE_OTHER): Payer: Managed Care, Other (non HMO)

## 2021-07-29 VITALS — BP 110/76 | HR 75 | Ht 65.0 in | Wt 198.0 lb

## 2021-07-29 DIAGNOSIS — R7989 Other specified abnormal findings of blood chemistry: Secondary | ICD-10-CM | POA: Diagnosis not present

## 2021-07-29 LAB — COMPREHENSIVE METABOLIC PANEL
ALT: 60 U/L — ABNORMAL HIGH (ref 0–53)
AST: 35 U/L (ref 0–37)
Albumin: 4.3 g/dL (ref 3.5–5.2)
Alkaline Phosphatase: 77 U/L (ref 39–117)
BUN: 20 mg/dL (ref 6–23)
CO2: 25 mEq/L (ref 19–32)
Calcium: 9.4 mg/dL (ref 8.4–10.5)
Chloride: 107 mEq/L (ref 96–112)
Creatinine, Ser: 0.97 mg/dL (ref 0.40–1.50)
GFR: 89.27 mL/min (ref >60.00)
Glucose, Bld: 106 mg/dL — ABNORMAL HIGH (ref 70–99)
Potassium: 4 mEq/L (ref 3.5–5.1)
Sodium: 139 mEq/L (ref 135–145)
Total Bilirubin: 0.6 mg/dL (ref 0.2–1.2)
Total Protein: 7.3 g/dL (ref 6.0–8.3)

## 2021-07-29 LAB — CBC
HCT: 45.8 % (ref 39.0–52.0)
Hemoglobin: 16 g/dL (ref 13.0–17.0)
MCHC: 34.9 g/dL (ref 30.0–36.0)
MCV: 90.5 fl (ref 78.0–100.0)
Platelets: 150 10*3/uL (ref 150.0–400.0)
RBC: 5.06 Mil/uL (ref 4.22–5.81)
RDW: 12.5 % (ref 11.5–15.5)
WBC: 5.6 10*3/uL (ref 4.0–10.5)

## 2021-07-29 LAB — PROTIME-INR
INR: 1.2 ratio — ABNORMAL HIGH (ref 0.8–1.0)
Prothrombin Time: 13.1 s (ref 9.6–13.1)

## 2021-07-29 NOTE — Progress Notes (Signed)
Review of pertinent gastrointestinal problems: ?1.  Adenomatous colon polyps.  Colonoscopy 03/2018 Dr. Ardis Hughs for minor rectal bleeding found 2 subcentimeter adenomas.  Also hemorrhoids. ? ?HPI: ?This is a very pleasant 56 year old man who was referred to me by Colon Branch, MD  to evaluate elevated liver tests.   ? ?He drinks 2 beers a day and 3 glasses of wine per day.  He drinks 2 Cokes per day and a lot of orange juice every day as well. ? ?Alcoholism does not run in his family.  Liver disease does not run in his family.  He has never had hepatitis that he is aware of. ? ?He was told that he had elevated liver test a year or 2 ago, but not before that. ? ?He does not have significant abdominal pains, he has normal bowel movements.  No overt GI bleeding.  No nausea or vomiting.   ? ?Old Data Reviewed: ? ? ?I last saw him at the time of a colonoscopy a little over 3 years ago.  He is here today referred by his primary care physician for a new problem. ? ? ?He is referred for elevated liver tests.  His AST and ALT have both been elevated for about 2 years now.  AST 38-42, ALT 64-76.  The rest of all of his liver tests are completely normal. ? ?Blood work 2022 hepatitis C antibody negative, hepatitis B surface antigen negative, hepatitis B core total antibody negative, alpha-1 antitrypsin level normal, CBC normal, normal platelets, TSH normal, PSA normal, iron 176, ferritin 156, protein electrophoresis was normal, ? ?02/2020 "ultrasound complete" indication elevated liver tests.  Findings multiple gallstones in the gallbladder.  Normal common bile duct.  "Echogenic liver compatible with hepatic steatosis." ? ? ? ? ?Review of systems: ?Pertinent positive and negative review of systems were noted in the above HPI section. All other review negative. ? ? ?Past Medical History:  ?Diagnosis Date  ? ED (erectile dysfunction)   ? GERD (gastroesophageal reflux disease)   ? H/O colonoscopy 2007  ?  for blood in stools: neg  except for int. hemorrhoids   ? ? ?Past Surgical History:  ?Procedure Laterality Date  ? BUNIONECTOMY Right ~2011  ? has a screw  ? COLONOSCOPY    ? INCISION AND DRAINAGE PERIRECTAL ABSCESS  12-2007  ? ? ?Current Outpatient Medications  ?Medication Instructions  ? diclofenac Sodium (VOLTAREN) 4 g, Topical, 4 times daily  ? FIBER PO 1 tablet, Oral, Daily  ? ibuprofen (ADVIL,MOTRIN) 800 MG tablet No dose, route, or frequency recorded.  ? multivitamin (ONE-A-DAY MEN'S) TABS tablet 1 tablet, Oral, Daily  ? omeprazole (PRILOSEC OTC) 20 mg, Daily  ? sildenafil (VIAGRA) 100 MG tablet TAKE 1/2-1 TABLETS BY MOUTH DAILY AS NEEDED FOR ERECTILE DYSFUNCTION.  ? ? ?Allergies as of 07/29/2021 - Review Complete 07/29/2021  ?Allergen Reaction Noted  ? Codeine sulfate Nausea Only 02/01/2007  ? ? ?Family History  ?Problem Relation Age of Onset  ? Diabetes Mother   ? Diabetes Brother   ?     48 of 11 brothers have DM  ? Lung cancer Brother 6  ? Colon cancer Neg Hx   ? Prostate cancer Neg Hx   ? CAD Neg Hx   ? Stroke Neg Hx   ? Esophageal cancer Neg Hx   ? ? ?Social History  ? ?Socioeconomic History  ? Marital status: Married  ?  Spouse name: Not on file  ? Number of children: 1  ?  Years of education: Not on file  ? Highest education level: Not on file  ?Occupational History  ? Occupation: Chiropractor business   ?Tobacco Use  ? Smoking status: Light Smoker  ?  Types: Cigars  ? Smokeless tobacco: Never  ? Tobacco comments:  ?  smokes rarely, cigars   ?Vaping Use  ? Vaping Use: Never used  ?Substance and Sexual Activity  ? Alcohol use: Yes  ?  Comment: 2 beers qhs  ? Drug use: No  ? Sexual activity: Not on file  ?Other Topics Concern  ? Not on file  ?Social History Narrative  ? Household: pt and wife  ?  1 step child ,   ? from [place]   ?    ? ?Social Determinants of Health  ? ?Financial Resource Strain: Not on file  ?Food Insecurity: Not on file  ?Transportation Needs: Not on file  ?Physical Activity: Not on file  ?Stress: Not on file   ?Social Connections: Not on file  ?Intimate Partner Violence: Not on file  ? ? ? ?Physical Exam: ?BP 110/76   Pulse 75   Ht '5\' 5"'$  (1.651 m)   Wt 198 lb (89.8 kg)   BMI 32.95 kg/m?  ?Constitutional: generally well-appearing ?Psychiatric: alert and oriented x3 ?Eyes: extraocular movements intact ?Mouth: oral pharynx moist, no lesions ?Neck: supple no lymphadenopathy ?Cardiovascular: heart regular rate and rhythm ?Lungs: clear to auscultation bilaterally ?Abdomen: soft, nontender, nondistended, no obvious ascites, no peritoneal signs, normal bowel sounds ?Extremities: no lower extremity edema bilaterally ?Skin: no lesions on visible extremities ? ? ?Assessment and plan: ?56 y.o. male with elevated liver tests, obesity ? ?I had a very nice discussion with him and explained to him that he has a real problem with the liquids that he is consuming.  He drinks 5 alcoholic beverages a day 2 very sugary beverages a day and juice several times a day.  I recommended that he significantly change his liquid choices.  I think he could very easily cut about 1000 cal/day if he drinks 1 glass of wine a day and may be 1 soda. ? ?His elevated liver tests are minor.  Likely a combination of fatty liver disease and alcoholic liver disease.  We are going to send him to the lab to check for several other possible etiologies including autoimmune hepatitis, hepatitis B.  See his AVS for further blood work.   ? ?He will return here to the office in 3 to 4 months for a weigh-in and hopefully he will make different choices about the liquids that he is consuming ? ?Please see the "Patient Instructions" section for addition details about the plan. ? ? ?Owens Loffler, MD ?[facility] ?07/29/2021, 11:21 AM ? ?Cc: Colon Branch, MD ? ?Total time on date of encounter was 45  minutes (this included time spent preparing to see the patient reviewing records; obtaining and/or reviewing separately obtained history; performing a medically  appropriate exam and/or evaluation; counseling and educating the patient and family if present; ordering medications, tests or procedures if applicable; and documenting clinical information in the health record). ? ? ? ? ? ? ? ? ? ?

## 2021-07-29 NOTE — Patient Instructions (Signed)
If you are age 54 or younger, your body mass index should be between 19-25. Your Body mass index is 32.95 kg/m?Marland Kitchen If this is out of the aformentioned range listed, please consider follow up with your Primary Care Provider.  ?________________________________________________________ ? ?The [place] [facility] providers would like to encourage you to use [facility] to communicate with providers for non-urgent requests or questions.  Due to long hold times on the telephone, sending your provider a message by [facility] may be a faster and more efficient way to get a response.  Please allow 48 business hours for a response.  Please remember that this is for non-urgent requests.  ?_______________________________________________________ ? ?Your provider has requested that you go to the basement level for lab work before leaving today. Press "B" on the elevator. The lab is located at the first door on the left as you exit the elevator. ? ?Decrease alcohol consumption as well as carbonated beverages and orange juice.  You should increase your water intake daily. ? ?Due to recent changes in healthcare laws, you may see the results of your imaging and laboratory studies on MyChart before your provider has had a chance to review them.  We understand that in some cases there may be results that are confusing or concerning to you. Not all laboratory results come back in the same time frame and the provider may be waiting for multiple results in order to interpret others.  Please give [place] 48 hours in order for your provider to thoroughly review all the results before contacting the office for clarification of your results.  ? ?Thank you for entrusting me with your care and choosing [facility]. ? ?Dr Ardis Hughs ? ?

## 2021-08-02 LAB — HEPATITIS A ANTIBODY, TOTAL: Hepatitis A AB,Total: REACTIVE — AB

## 2021-08-02 LAB — MITOCHONDRIAL ANTIBODIES: Mitochondrial M2 Ab, IgG: <=20 U (ref <=20.0)

## 2021-08-02 LAB — CERULOPLASMIN: Ceruloplasmin: 26 mg/dL (ref 18–36)

## 2021-08-02 LAB — ANA: Anti Nuclear Antibody (ANA): NEGATIVE

## 2021-08-02 LAB — HEPATITIS B SURFACE ANTIBODY,QUALITATIVE: Hep B S Ab: NONREACTIVE

## 2021-08-02 LAB — TISSUE TRANSGLUTAMINASE, IGA: (tTG) Ab, IgA: <1 U/mL

## 2021-08-02 LAB — IGA: Immunoglobulin A: 204 mg/dL (ref 47–310)

## 2021-08-02 LAB — ANTI-SMOOTH MUSCLE ANTIBODY, IGG: Actin (Smooth Muscle) Antibody (IGG): <20 U (ref <20)

## 2021-08-26 ENCOUNTER — Other Ambulatory Visit: Payer: Self-pay | Admitting: Internal Medicine

## 2021-11-06 ENCOUNTER — Ambulatory Visit (INDEPENDENT_AMBULATORY_CARE_PROVIDER_SITE_OTHER): Payer: Managed Care, Other (non HMO)

## 2021-11-06 ENCOUNTER — Ambulatory Visit: Payer: Managed Care, Other (non HMO) | Admitting: Podiatry

## 2021-11-06 DIAGNOSIS — M722 Plantar fascial fibromatosis: Secondary | ICD-10-CM | POA: Diagnosis not present

## 2021-11-06 DIAGNOSIS — Z472 Encounter for removal of internal fixation device: Secondary | ICD-10-CM

## 2021-11-06 MED ORDER — DICLOFENAC SODIUM 75 MG PO TBEC: 75.0000 mg | DELAYED_RELEASE_TABLET | Freq: Two times a day (BID) | ORAL | 2 refills | Status: DC

## 2021-11-06 MED ORDER — TRIAMCINOLONE ACETONIDE 10 MG/ML IJ SUSP
10.0000 mg | Freq: Once | INTRAMUSCULAR | Status: AC
  Administered 2021-11-06: 10 mg

## 2021-11-06 NOTE — Patient Instructions (Signed)

## 2021-11-07 ENCOUNTER — Telehealth: Payer: Self-pay

## 2021-11-07 DIAGNOSIS — R7989 Other specified abnormal findings of blood chemistry: Secondary | ICD-10-CM

## 2021-11-07 NOTE — Telephone Encounter (Signed)
-----   Message from [place] sent at 08/12/2021  9:15 AM EDT ----- Regarding: 4 month Needs to see DJ in Aug needs CMP prior

## 2021-11-07 NOTE — Telephone Encounter (Signed)
Attempted to reach patient by phone to schedule follow up appointment, but there was no answer and voicemail box has not been set up.  Will continue efforts.

## 2021-11-07 NOTE — Progress Notes (Signed)
Subjective:   Patient ID: Rodena Medin, male   DOB: 56 y.o.   MRN: [id number]   HPI Patient presents after not being seen for a number of years with excellent correction bunion left 1 possible prominent pin position and discomfort which is developed in the plantar aspect of the right heel quite a bit with mild discomfort in the forefoot left after the heel started to hurt.  States its been hurting for a number of months and is worse when he gets up and after periods of sitting.  Patient does not currently smoke and does like to be active   Review of Systems  All other systems reviewed and are negative.       Objective:  Physical Exam Vitals and nursing note reviewed.  Constitutional:      Appearance: He is well-developed.  Pulmonary:     Effort: Pulmonary effort is normal.  Musculoskeletal:        General: Normal range of motion.  Skin:    General: Skin is warm.  Neurological:     Mental Status: He is alert.     Neurovascular status intact muscle strength found to be adequate range of motion within normal limits.  Patient is noted to have exquisite discomfort plantar aspect right heel at the insertional point of the tendon into the calcaneus with inflammation fluid buildup and does have mild discomfort in the lesser MPJs left not intense no swelling and does have a small area on top of the left first metatarsal where the distal pin might be causing a small reactive tissue formation nonpainful     Assessment:  Acute plantar fasciitis right with inflammation fluid buildup along with abnormal nail position left and mild capsulitis left     Plan:  H&P x-rays of both reviewed and for the right I did do sterile prep injected the plantar fascia 3 mg Kenalog 5 mg Xylocaine instructed on anti-inflammatories dispensed fascial brace with all instructions on usage and it was put on his foot with education on how it looks his arch properly.  Reappoint to recheck in 2 weeks and also placed  him on diclofenac  X-rays indicate good healing of osteotomy first metatarsal bilateral good positional component with small spur formation plantar heel right moderate depression of the arch noted no indication stress fracture

## 2021-11-12 NOTE — Telephone Encounter (Signed)
Patient aware that he is scheduled to follow up with Dr Ardis Hughs on 12-13-21 at 11:10am.  He will need CMP prior to the appointment.  He was advised that no appointment is needed for lab work.  Patient agreed to plan and verbalized understanding.  No further questions.

## 2021-11-27 ENCOUNTER — Ambulatory Visit: Payer: Managed Care, Other (non HMO) | Admitting: Podiatry

## 2021-11-27 ENCOUNTER — Encounter: Payer: Self-pay | Admitting: Podiatry

## 2021-11-27 DIAGNOSIS — M722 Plantar fascial fibromatosis: Secondary | ICD-10-CM | POA: Diagnosis not present

## 2021-11-29 NOTE — Progress Notes (Signed)
Subjective:   Patient ID: Rodena Medin, male   DOB: 56 y.o.   MRN: [id number]   HPI Patient states he is feeling quite a bit better stating he is having minimal discomfort currently very pleased   ROS      Objective:  Physical Exam  Neurovascular status intact with the patient's right heel doing much better pain only upon deep palpation but significantly improved     Assessment:  Doing well with history of [place] fasciitis right with treatment     Plan:  Reviewed condition and recommended the continuation of shoe gear modification anti-inflammatory physical therapy and patient will be seen back for [place] to recheck as needed may require orthotics long-term and I educated him on today

## 2021-12-16 ENCOUNTER — Ambulatory Visit: Payer: Managed Care, Other (non HMO) | Admitting: Gastroenterology

## 2022-01-20 ENCOUNTER — Other Ambulatory Visit (INDEPENDENT_AMBULATORY_CARE_PROVIDER_SITE_OTHER): Payer: Managed Care, Other (non HMO)

## 2022-01-20 ENCOUNTER — Encounter: Payer: Self-pay | Admitting: Gastroenterology

## 2022-01-20 ENCOUNTER — Ambulatory Visit: Payer: Managed Care, Other (non HMO) | Admitting: Gastroenterology

## 2022-01-20 VITALS — BP 100/66 | HR 76 | Ht 63.25 in | Wt 196.0 lb

## 2022-01-20 DIAGNOSIS — K760 Fatty (change of) liver, not elsewhere classified: Secondary | ICD-10-CM

## 2022-01-20 LAB — COMPREHENSIVE METABOLIC PANEL
ALT: 66 U/L — ABNORMAL HIGH (ref 0–53)
AST: 39 U/L — ABNORMAL HIGH (ref 0–37)
Albumin: 4.3 g/dL (ref 3.5–5.2)
Alkaline Phosphatase: 84 U/L (ref 39–117)
BUN: 14 mg/dL (ref 6–23)
CO2: 26 mEq/L (ref 19–32)
Calcium: 9.5 mg/dL (ref 8.4–10.5)
Chloride: 105 mEq/L (ref 96–112)
Creatinine, Ser: 1.04 mg/dL (ref 0.40–1.50)
GFR: 81.84 mL/min (ref >60.00)
Glucose, Bld: 104 mg/dL — ABNORMAL HIGH (ref 70–99)
Potassium: 3.9 mEq/L (ref 3.5–5.1)
Sodium: 139 mEq/L (ref 135–145)
Total Bilirubin: 0.5 mg/dL (ref 0.2–1.2)
Total Protein: 7.6 g/dL (ref 6.0–8.3)

## 2022-01-20 LAB — GAMMA GT: GGT: 95 U/L — ABNORMAL HIGH (ref 7–51)

## 2022-01-20 NOTE — Patient Instructions (Addendum)
If you are age 54 or older, your body mass index should be between 23-30. Your Body mass index is 34.45 kg/m. If this is out of the aforementioned range listed, please consider follow up with your Primary Care Provider.  If you are age 8 or younger, your body mass index should be between 19-25. Your Body mass index is 34.45 kg/m. If this is out of the aformentioned range listed, please consider follow up with your Primary Care Provider.   Your provider has requested that you go to the basement level for lab work before leaving today. Press "B" on the elevator. The lab is located at the first door on the left as you exit the elevator.  The [facility] providers would like to encourage you to use [facility] to communicate with providers for non-urgent requests or questions.  Due to long hold times on the telephone, sending your provider a message by [facility] may be a faster and more efficient way to get a response.  Please allow 48 business hours for a response.  Please remember that this is for non-urgent requests.   It was a pleasure to see you today!  Thank you for trusting me with your gastrointestinal care!    Scott E.Candis Schatz, MD

## 2022-01-20 NOTE — Progress Notes (Signed)
HPI : Drew Estrada is a very pleasant 56 year old male previously seen by Dr. 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  At his last visit in April, Dr. Ardis Hughs recommended that the patient significantly reduce his consumption of liquid calories in the form of sodas, juice and decrease his alcohol consumption. Today, the patient reports that he has been able to cut out sodas, although he has replaced his Coke with diet Coke.  He also now drinks orange juice only on occasion as opposed to daily.  He has reduced his alcohol intake modestly, still continuing to drink 1-2 beers daily plus a glass of wine with dinner most days.  He very rarely drinks liquor. He continues to exercise sporadically, sometimes 1 day a week, sometimes 3 to 4 days a week.  His weight has been stable. He denies any chronic GI symptoms.   From Dr. Ardis Hughs last note: Old Data Reviewed:     I last saw him at the time of a colonoscopy a little over 3 years ago.  He is here today referred by his primary care physician for a new problem.     He is referred for elevated liver tests.  His AST and ALT have both been elevated for about 2 years now.  AST 38-42, ALT 64-76.  The rest of all of his liver tests are completely normal.   Blood work 2022 hepatitis C antibody negative, hepatitis B surface antigen negative, hepatitis B core total antibody negative, alpha-1 antitrypsin level normal, CBC normal, normal platelets, TSH normal, PSA normal, iron 176, ferritin 156, protein electrophoresis was normal,   02/2020 "ultrasound complete" indication elevated liver tests.  Findings multiple gallstones in the gallbladder.  Normal common bile duct.  "Echogenic liver compatible with hepatic steatosis."    Past Medical History:  Diagnosis Date   ED (erectile dysfunction)    GERD (gastroesophageal reflux disease)    H/O colonoscopy 2007    for blood in  stools: neg except for int. hemorrhoids      Past Surgical History:  Procedure Laterality Date   BUNIONECTOMY Right ~2011   has a screw   COLONOSCOPY     INCISION AND DRAINAGE PERIRECTAL ABSCESS  12-2007   Family History  Problem Relation Age of Onset   Diabetes Mother    Diabetes Brother        22 of 21 brothers have DM   Lung cancer Brother 55   Colon cancer Neg Hx    Prostate cancer Neg Hx    CAD Neg Hx    Stroke Neg Hx    Esophageal cancer Neg Hx    Social History   Tobacco Use   Smoking status: Light Smoker    Types: Cigars   Smokeless tobacco: Never   Tobacco comments:    smokes rarely, cigars   Vaping Use   Vaping Use: Never used  Substance Use Topics   Alcohol use: Yes    Comment: 2 beers qhs   Drug use: No   Current Outpatient Medications  Medication Sig Dispense Refill   diclofenac (VOLTAREN) 75 MG EC tablet Take 1 tablet (75 mg total) by mouth 2 (two) times daily. 50 tablet 2   diclofenac Sodium (VOLTAREN) 1 % GEL Apply 4 g topically 4 (four) times daily. 100 g 5   FIBER PO Take 1 tablet by mouth daily.     ibuprofen (ADVIL,MOTRIN) 800 MG tablet  0   multivitamin (ONE-A-DAY MEN'S) TABS tablet Take 1 tablet by mouth daily.     omeprazole (PRILOSEC OTC) 20 MG tablet Take 20 mg by mouth daily.     sildenafil (VIAGRA) 100 MG tablet TAKE 1/2-1 TABLET BY MOUTH DAILY AS NEEDED FOR ERECTILE DYSFUNCTION 10 tablet 6   No current facility-administered medications for this visit.   Allergies  Allergen Reactions   Codeine Sulfate Nausea Only     Review of Systems: All systems reviewed and negative except where noted in HPI.    No results found.  Physical Exam: BP 100/66 (BP Location: Left Arm, Patient Position: Sitting, Cuff Size: Normal)   Pulse 76   Ht 5' 3.25" (1.607 m) Comment: height measured without shoes  Wt 196 lb (88.9 kg)   BMI 34.45 kg/m  Constitutional: Pleasant,well-developed, Hispanic male in no acute distress. HEENT: Normocephalic and  atraumatic. Conjunctivae are normal. No scleral icterus. Extremities: no edema Neurological: Alert and oriented to person place and time. Skin: Skin is warm and dry. No rashes noted. Psychiatric: Normal mood and affect. Behavior is normal.  CBC    Component Value Date/Time   WBC 5.6 07/29/2021 1146   RBC 5.06 07/29/2021 1146   HGB 16.0 07/29/2021 1146   HCT 45.8 07/29/2021 1146   PLT 150.0 07/29/2021 1146   MCV 90.5 07/29/2021 1146   MCHC 34.9 07/29/2021 1146   RDW 12.5 07/29/2021 1146   LYMPHSABS 2.2 03/18/2021 1430   MONOABS 0.5 03/18/2021 1430   EOSABS 0.1 03/18/2021 1430   BASOSABS 0.0 03/18/2021 1430    CMP     Component Value Date/Time   NA 139 07/29/2021 1146   K 4.0 07/29/2021 1146   CL 107 07/29/2021 1146   CO2 25 07/29/2021 1146   GLUCOSE 106 (H) 07/29/2021 1146   BUN 20 07/29/2021 1146   CREATININE 0.97 07/29/2021 1146   CALCIUM 9.4 07/29/2021 1146   PROT 7.3 07/29/2021 1146   ALBUMIN 4.3 07/29/2021 1146   AST 35 07/29/2021 1146   ALT 60 (H) 07/29/2021 1146   ALKPHOS 77 07/29/2021 1146   BILITOT 0.6 07/29/2021 1146   GFRNONAA 87.57 03/12/2009 0959   GFRAA 108 02/01/2007 1328     ASSESSMENT AND PLAN: 56 year old male with fatty liver disease likely 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  He has made some modest lifestyle changes may help with his liver, but has not made any significant changes that would likely result in reversal of his fatty liver.  I commended him for stopping soda and reducing his juice consumption.  I recommended he make continued efforts to further reduce his alcohol intake on a daily basis.  In terms of his current diet, he is still eating animal crackers most every day.  I recommended this be his next focus to eliminate or replace with something healthier.  I also recommended he try to make physical activity more regular part of his routine and to try to work to losing 20 pounds over the next 6 months. The patient  is aware of the potential progression of fatty liver disease to cirrhosis if he does not change his lifestyle. I encouraged him to drink coffee, as this has been associated with improved liver health, but he should drink coffee without any sugar or creamer. We will repeat liver enzymes today.  Plan to get repeat ultrasound or elastography sometime in the next 1 to 2 years.  Follow-up in 6 months with either myself or Dr. Ardis Hughs.  Fatty liver disease - Repeat CMP, GGT today and again in 6 months - Continue to eliminate sugars from diet - Decrease alcohol to 1-2 drinks per day - Follow-up 6 months - Recommend 20 pound weight loss through improved diet and increased physical activity   Jasen Hartstein E. Candis Schatz, MD [facility]   [place], MD

## 2022-01-21 NOTE — Progress Notes (Signed)
Mr. Drew Estrada,  Your liver enzymes are still slightly elevated, indicating ongoing inflammation/damage to the liver.  Please continue to make the lifestyle we discussed in clinic, namely reducing alcohol and sugar consumption. See you in 6 months

## 2022-03-24 ENCOUNTER — Telehealth: Payer: Self-pay | Admitting: Internal Medicine

## 2022-03-24 ENCOUNTER — Encounter: Payer: Managed Care, Other (non HMO) | Admitting: Internal Medicine

## 2022-03-24 NOTE — Telephone Encounter (Signed)
LVM for patient to call back and reschedule CPE.

## 2022-04-14 ENCOUNTER — Encounter: Payer: Self-pay | Admitting: Internal Medicine

## 2022-04-14 ENCOUNTER — Ambulatory Visit (INDEPENDENT_AMBULATORY_CARE_PROVIDER_SITE_OTHER): Payer: Managed Care, Other (non HMO) | Admitting: Internal Medicine

## 2022-04-14 VITALS — BP 126/82 | HR 73 | Temp 98.2°F | Resp 16 | Ht 63.25 in | Wt 194.1 lb

## 2022-04-14 DIAGNOSIS — E781 Pure hyperglyceridemia: Secondary | ICD-10-CM

## 2022-04-14 DIAGNOSIS — Z0000 Encounter for general adult medical examination without abnormal findings: Secondary | ICD-10-CM

## 2022-04-14 DIAGNOSIS — E669 Obesity, unspecified: Secondary | ICD-10-CM | POA: Diagnosis not present

## 2022-04-14 LAB — CBC WITH DIFFERENTIAL/PLATELET
Basophils Absolute: 0 10*3/uL (ref 0.0–0.1)
Basophils Relative: 0.5 % (ref 0.0–3.0)
Eosinophils Absolute: 0.2 10*3/uL (ref 0.0–0.7)
Eosinophils Relative: 2.6 % (ref 0.0–5.0)
HCT: 46.2 % (ref 39.0–52.0)
Hemoglobin: 15.9 g/dL (ref 13.0–17.0)
Lymphocytes Relative: 35.8 % (ref 12.0–46.0)
Lymphs Abs: 2.2 10*3/uL (ref 0.7–4.0)
MCHC: 34.4 g/dL (ref 30.0–36.0)
MCV: 90.6 fl (ref 78.0–100.0)
Monocytes Absolute: 0.4 10*3/uL (ref 0.1–1.0)
Monocytes Relative: 7 % (ref 3.0–12.0)
Neutro Abs: 3.3 10*3/uL (ref 1.4–7.7)
Neutrophils Relative %: 54.1 % (ref 43.0–77.0)
Platelets: 180 10*3/uL (ref 150.0–400.0)
RBC: 5.11 Mil/uL (ref 4.22–5.81)
RDW: 13 % (ref 11.5–15.5)
WBC: 6.1 10*3/uL (ref 4.0–10.5)

## 2022-04-14 LAB — LIPID PANEL
Cholesterol: 173 mg/dL (ref 0–200)
HDL: 48.2 mg/dL (ref >39.00)
LDL Cholesterol: 96 mg/dL (ref 0–99)
NonHDL: 125.22
Total CHOL/HDL Ratio: 4
Triglycerides: 144 mg/dL (ref 0.0–149.0)
VLDL: 28.8 mg/dL (ref 0.0–40.0)

## 2022-04-14 LAB — HEMOGLOBIN A1C: Hgb A1c MFr Bld: 5.5 % (ref 4.6–6.5)

## 2022-04-14 LAB — PSA: PSA: 1.85 ng/mL (ref 0.10–4.00)

## 2022-04-14 NOTE — Assessment & Plan Note (Signed)
-  Td 04-2016 - s/p Shingrix x 2 per pt  - covid vax: las vax 01-2022 thus UTD -had a Flu shot - CCS: Had a  Cscope in 2007 for blood in stools; again 03/2018, polyps,  Next 2024 per GI letter -Prostate cancer screening: DRE normal last year,  check PSA - etoh: Currently drinking 2 glasses of wine daily, recommend to cut down more to perhaps just 1 glass. -Very rarely smokes a cigar -Diet, exercise: Doing better, see comments under increased LFTs. -labs: FLP PSA CBC A1c

## 2022-04-14 NOTE — Assessment & Plan Note (Signed)
Here for CPX GERD: Not an issue at this point. Obesity and FH DM: Checking A1c. Increased LFTs: Last visit with GI 01/20/2022, extensive dietary-etoh advice provided.  20 pound weight loss was Rx.  He is actually doing great, has cut down on sugars, regular sodas, eating healthier in general.  In his own scales has lost 6 pounds. RTC 1 year.

## 2022-04-14 NOTE — Patient Instructions (Addendum)
It was good to see you today  GO TO THE LAB : Get the blood work     [place], [place] Come back for physical exam in 1 year

## 2022-04-14 NOTE — Progress Notes (Signed)
Subjective:    Patient ID: Drew Estrada, male    DOB: 1965-06-18, 56 y.o.   MRN: [id number]  DOS:  04/14/2022 Type of visit - description: CPX  Here for CPX. Since the last office visit saw GI, notes reviewed. Feels well and has no major concerns.  Wt Readings from Last 3 Encounters:  04/14/22 194 lb 2 oz (88.1 kg)  01/20/22 196 lb (88.9 kg)  07/29/21 198 lb (89.8 kg)    Review of Systems   A 14 point review of systems is negative   Past Medical History:  Diagnosis Date   ED (erectile dysfunction)    GERD (gastroesophageal reflux disease)    H/O colonoscopy 2007    for blood in stools: neg except for int. hemorrhoids     Past Surgical History:  Procedure Laterality Date   BUNIONECTOMY Right ~2011   has a screw   COLONOSCOPY     INCISION AND DRAINAGE PERIRECTAL ABSCESS  12-2007   Social History   Socioeconomic History   Marital status: Married    Spouse name: Not on file   Number of children: 1   Years of education: Not on file   Highest education level: Not on file  Occupational History   Occupation: restaurant business   Tobacco Use   Smoking status: Light Smoker    Types: Cigars   Smokeless tobacco: Never   Tobacco comments:    smokes rarely, cigars   Vaping Use   Vaping Use: Never used  Substance and Sexual Activity   Alcohol use: Yes    Comment: wine 2 qd   Drug use: No   Sexual activity: Not on file  Other Topics Concern   Not on file  Social History Narrative   Household: pt and wife    1 step child ,    from [place]        Social Determinants of Health   Financial Resource Strain: Not on file  Food Insecurity: Not on file  Transportation Needs: Not on file  Physical Activity: Not on file  Stress: Not on file  Social Connections: Not on file  Intimate Partner Violence: Not on file    Current Outpatient Medications  Medication Instructions   diclofenac Sodium (VOLTAREN) 4 g, Topical, 4 times daily   FIBER PO 1 tablet,  Oral, Daily   ibuprofen (ADVIL,MOTRIN) 800 MG tablet No dose, route, or frequency recorded.   multivitamin (ONE-A-DAY MEN'S) TABS tablet 1 tablet, Oral, Daily   omeprazole (PRILOSEC OTC) 20 mg, Daily   sildenafil (VIAGRA) 100 MG tablet TAKE 1/2-1 TABLET BY MOUTH DAILY AS NEEDED FOR ERECTILE DYSFUNCTION       Objective:   Physical Exam BP 126/82   Pulse 73   Temp 98.2 F (36.8 C) (Oral)   Resp 16   Ht 5' 3.25" (1.607 m)   Wt 194 lb 2 oz (88.1 kg)   SpO2 96%   BMI 34.12 kg/m  General: Well developed, NAD, BMI noted Neck: No  thyromegaly  HEENT:  Normocephalic . Face symmetric, atraumatic Lungs:  CTA B Normal respiratory effort, no intercostal retractions, no accessory muscle use. Heart: RRR,  no murmur.  Abdomen:  Not distended, soft, non-tender. No rebound or rigidity.   Lower extremities: no pretibial edema bilaterally  Skin: Exposed areas without rash. Not pale. Not jaundice Neurologic:  alert & oriented X3.  Speech normal, gait appropriate for age and unassisted Strength symmetric and appropriate for age.  Psych: Cognition and  judgment appear intact.  Cooperative with normal attention span and concentration.  Behavior appropriate. No anxious or depressed appearing.     Assessment    Assessment  GERD Hemorrhoids.  Colonoscopy 03/2018, 2 polyps, + internal hemorrhoids.  ED (viagra prn) Strong FH DM  (7 of 11 brothers) Increased LFTs: -[place] 02-2020: Fatty liver, GB stones  -Hep B, hep C: Negative -Saw GI 12-2021.  F/u rx : 6 months  PLAN Here for CPX GERD: Not an issue at this point. Obesity and FH DM: Checking A1c. Increased LFTs: Last visit with GI 01/20/2022, extensive dietary-etoh advice provided.  20 pound weight loss was Rx.  He is actually doing great, has cut down on sugars, regular sodas, eating healthier in general.  In his own scales has lost 6 pounds. RTC 1 year.

## 2022-04-21 ENCOUNTER — Telehealth: Payer: Self-pay | Admitting: *Deleted

## 2022-04-21 NOTE — Telephone Encounter (Signed)
Transition Care Management Unsuccessful Follow-up Telephone Call  Date of discharge and from where:  04/18/22 [facility]  Attempts:  1st Attempt  Reason for unsuccessful TCM follow-up call:  Left voice message

## 2022-04-23 ENCOUNTER — Telehealth: Payer: Self-pay | Admitting: *Deleted

## 2022-04-23 NOTE — Telephone Encounter (Signed)
Transition Care Management Unsuccessful Follow-up Telephone Call  Date of discharge and from where:  04/18/22 [place] [facility]  Attempts:  2nd Attempt  Reason for unsuccessful TCM follow-up call:  Left voice message

## 2022-04-24 ENCOUNTER — Telehealth: Payer: Self-pay | Admitting: *Deleted

## 2022-04-24 NOTE — Telephone Encounter (Signed)
Transition Care Management Unsuccessful Follow-up Telephone Call  Date of discharge and from where:  04/18/22 [place] [facility]  Attempts:  3rd Attempt  Reason for unsuccessful TCM follow-up call:  Left voice message

## 2022-06-07 IMAGING — US US ABDOMEN COMPLETE
2 series · 13 of 25 positions shown · non-contrast
Comparison: None.

CLINICAL DATA: Elevated LFTs.

EXAM:
ABDOMEN ULTRASOUND COMPLETE

[Series 1: us abdomen complete · 12 of 63 slices shown (1 of 2)]
[im 1/63]
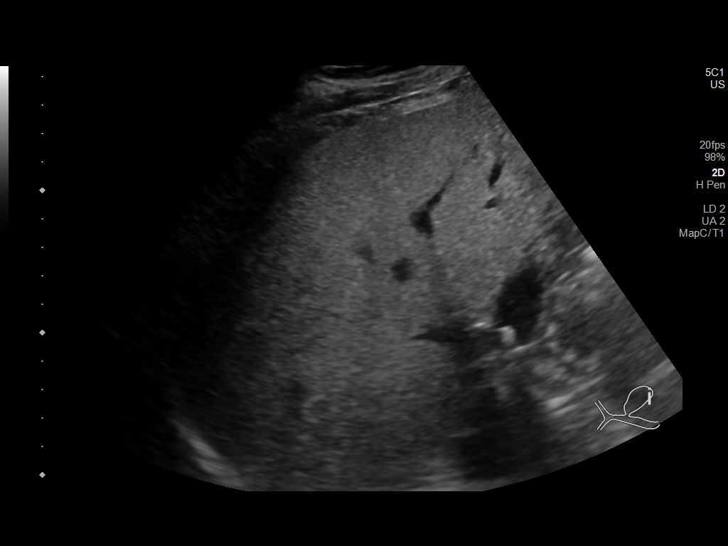
[im 6/63]
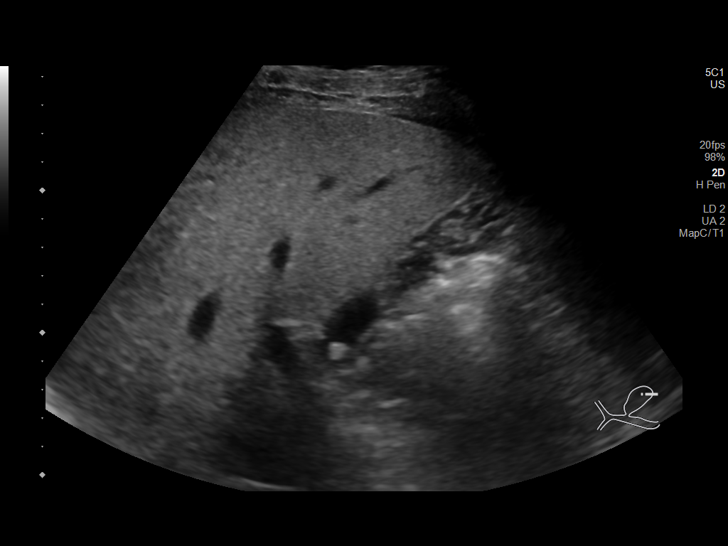
[im 11/63]
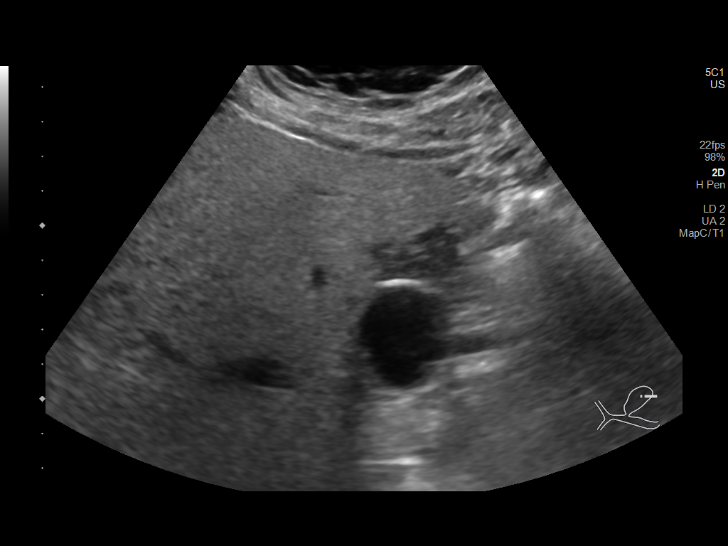
[im 17/63]
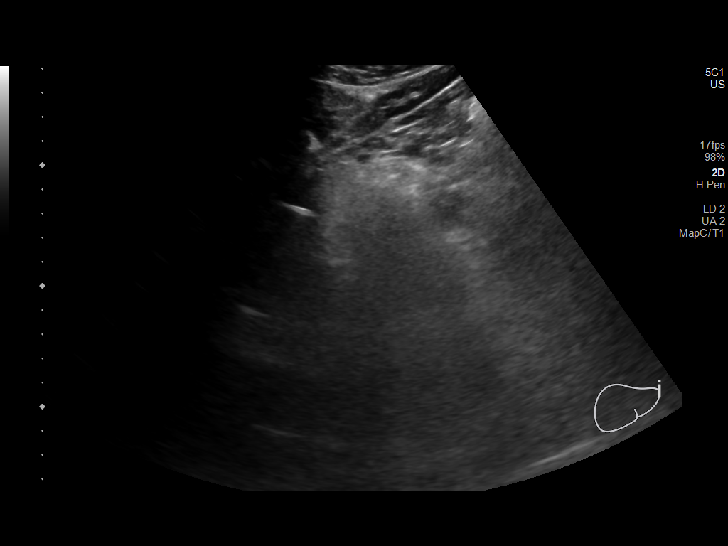
[im 22/63]
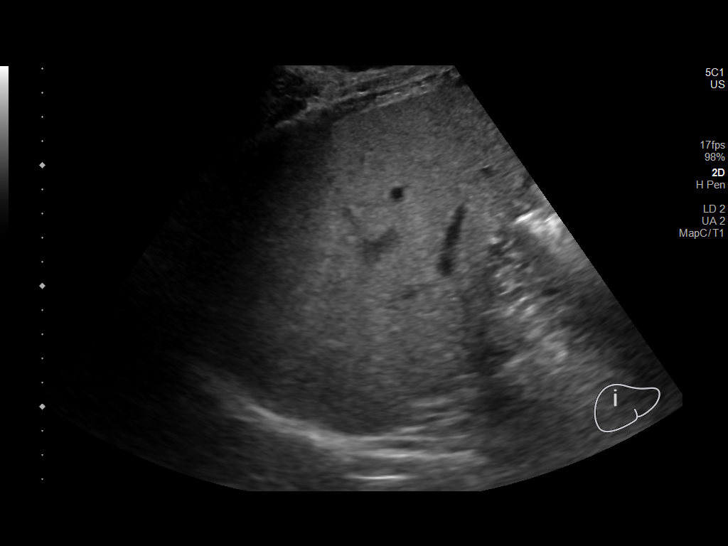
[im 27/63]
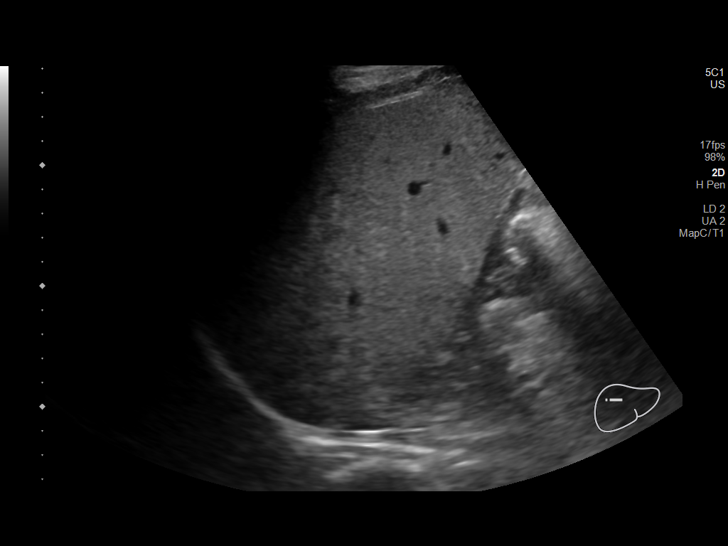
[im 33/63]
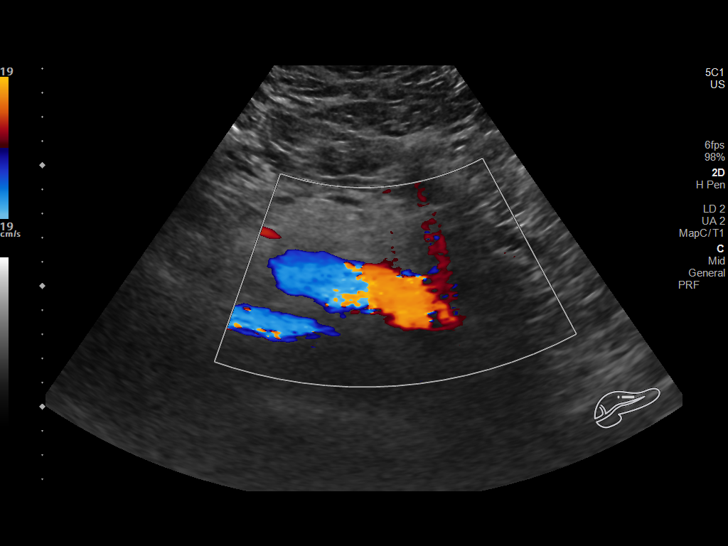
[im 38/63]
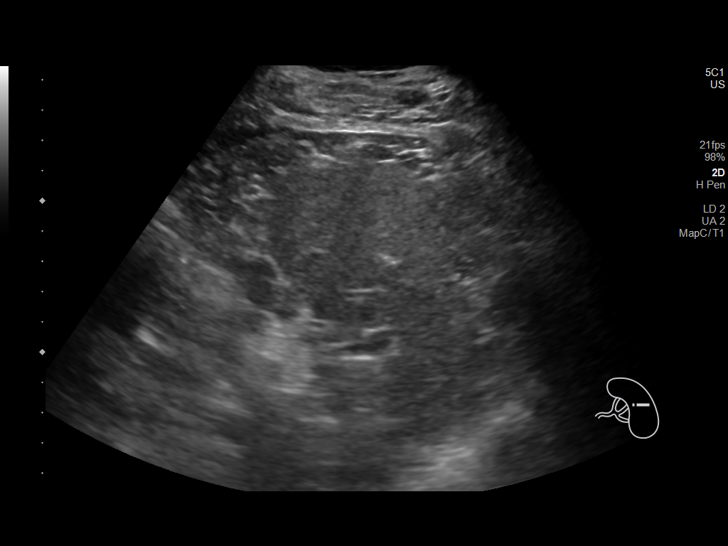
[im 44/63]
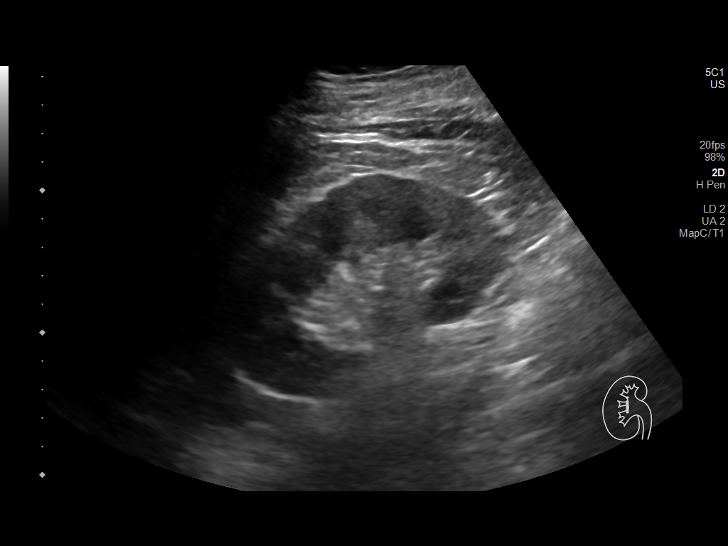
[im 49/63]
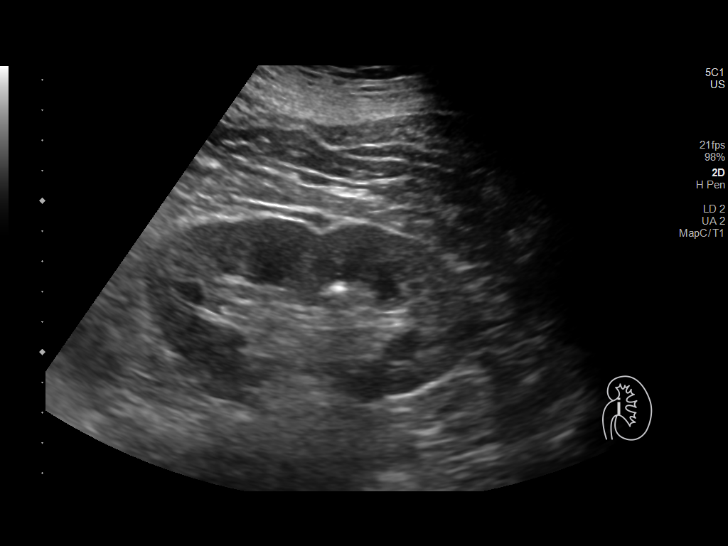
[im 54/63]
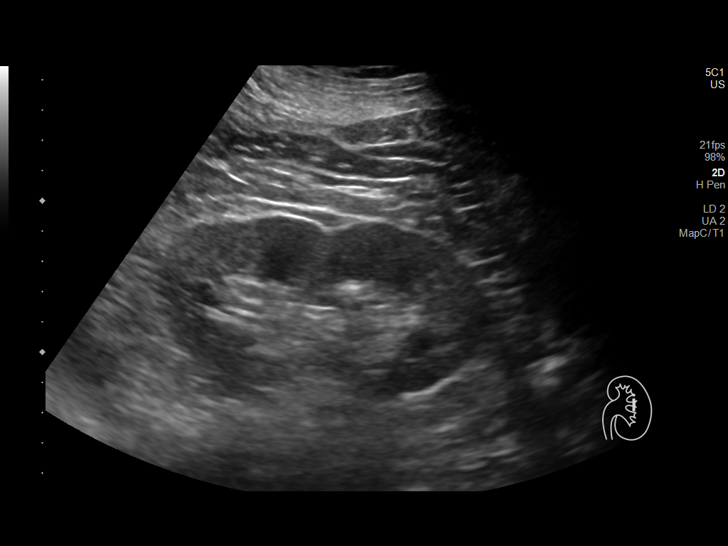
[im 60/63]
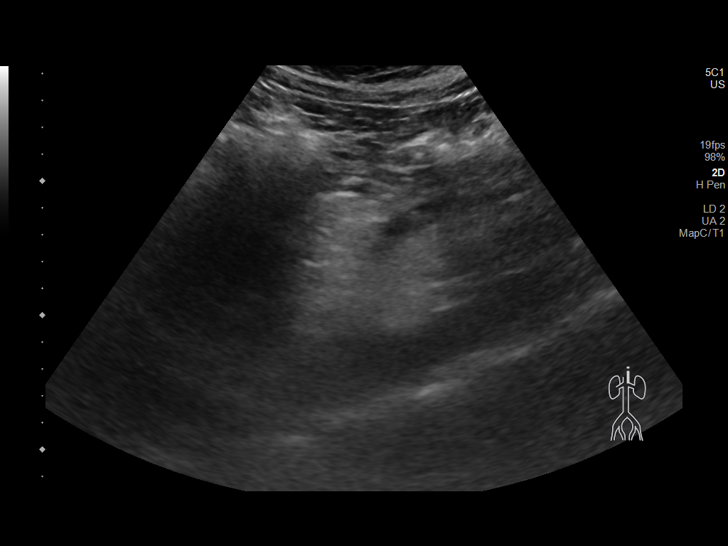

[Series 2: us abdomen complete · 1 of 1 slices shown (2 of 2)]
[im 1/1]
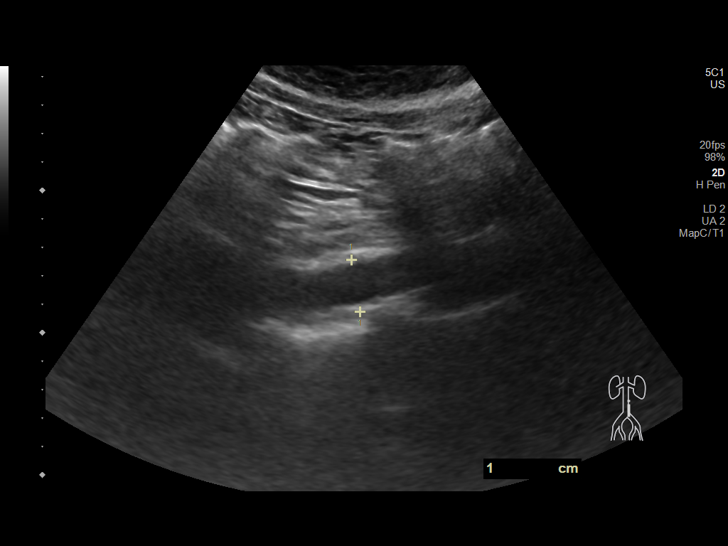

[13 of 25 positions shown; findings below may reference images not displayed]

FINDINGS: Gallbladder: Multiple gallstones are identified measuring up to 8
mm. There is gallbladder wall thickening which measures 3.8 mm in
maximum thickness. Negative sonographic Murphy's sign.

Common bile duct: Diameter: 3.5 mm

Liver: No focal lesion identified. Diffusely increased parenchymal
echogenicity noted compatible with hepatic steatosis. Portal vein is
patent on color Doppler imaging with normal direction of blood flow
towards the liver.

IVC: No abnormality visualized.

Pancreas: Visualized portion unremarkable.

Spleen: Size and appearance within normal limits.

Right Kidney: Length: 10.8 cm. Echogenicity within normal limits. No
mass or hydronephrosis visualized.

Left Kidney: Length: 11.4 cm. Stone within the inferior pole
measures 0.8 cm. Echogenicity within normal limits. No mass or
hydronephrosis visualized.

Abdominal aorta: No aneurysm visualized.

Other findings: None.
IMPRESSION: 1. Echogenic liver compatible with hepatic steatosis.
2. Gallstones and gallbladder wall thickening. No pericholecystic
fluid or sonographic Murphy's sign. Correlate for any clinical signs
or symptoms of cholecystitis.
3. Nonobstructing left renal calculi.
4. These results will be called to the ordering clinician or
representative by the Radiologist Assistant, and communication
documented in the PACS or [REDACTED].

## 2022-12-15 ENCOUNTER — Other Ambulatory Visit: Payer: Self-pay | Admitting: Internal Medicine

## 2022-12-29 ENCOUNTER — Encounter: Payer: Self-pay | Admitting: Internal Medicine

## 2022-12-29 ENCOUNTER — Ambulatory Visit: Payer: Managed Care, Other (non HMO) | Admitting: Internal Medicine

## 2022-12-29 VITALS — BP 132/84 | HR 70 | Temp 98.1°F | Resp 16 | Ht 63.25 in | Wt 197.2 lb

## 2022-12-29 DIAGNOSIS — M25571 Pain in right ankle and joints of right foot: Secondary | ICD-10-CM

## 2022-12-29 DIAGNOSIS — M25572 Pain in left ankle and joints of left foot: Secondary | ICD-10-CM

## 2022-12-29 NOTE — Assessment & Plan Note (Signed)
Ankle pain: Has episodes of right or left ankle swelling with pain.  Today denies pain and physical exam is normal. This is possible gout, information about this condition provided, recommend to come back to the office the same day the symptoms develop to secure a diagnosis. Left hip pain: As described above, exam negative.  We agreed on observation Vaccine advice provided

## 2022-12-29 NOTE — Patient Instructions (Addendum)
Vaccines I recommend: Covid booster- new this fall Flu shot this fall Shingrix (shingles)    Gota   Gout  La gota es una hinchazn dolorosa de las articulaciones. La gota es un tipo de artritis. Es causada por el exceso de cido rico en el cuerpo. El cido rico es una sustancia qumica que se produce cuando el cuerpo descompone sustancias llamadas purinas. Si el cuerpo tiene un exceso de cido rico, se pueden formar cristales con punta y acumularse en las articulaciones. Esto provoca dolor e hinchazn. Las crisis de gota pueden ocurrir rpidamente y ser muy dolorosas (gota [place]). Con el tiempo, las crisis pueden afectar ms articulaciones y ocurrir con mayor frecuencia (gota crnica). Cules son las causas? La gota es causada por la acumulacin de cido rico en la sangre. Esto puede ocurrir debido a lo siguiente: Los riones no [employer] cantidad suficiente de cido rico de Risk manager. El cuerpo produce demasiado cido rico. Come demasiados alimentos ricos en purinas. Estos alimentos incluyen vsceras, algunos mariscos y Education officer, museum. Los traumatismos o el estrs pueden provocar un ataque. Qu incrementa el riesgo? Tener antecedentes familiares de gota. Ser hombre de [employer]. Ser mujer y haber atravesado la menopausia. Haber tenido un trasplante de rgano. Tomar ciertos medicamentos. Tener ciertas afecciones, tales como: Tener mucho sobrepeso (obesidad). Intoxicacin con plomo. Enfermedad renal. Una afeccin cutnea llamada psoriasis. Otros riesgos incluyen: Perder peso con demasiada rapidez. No tener la cantidad suficiente de agua en el organismo (estar deshidratado). Beber alcohol, en especial, cerveza. Tomar bebidas endulzadas con un tipo de azcar llamado fructosa. Cules son los signos o sntomas? Con frecuencia, un ataque de gota agudo comienza a la noche y suele ocurrir solo en Risk analyst. El lugar ms comn es el pulgar del pie. Tambin pueden verse  afectadas las articulaciones de los pies, los tobillos, las rodillas, los dedos de la mano, las muecas o los codos. Entre los sntomas, se pueden incluir los siguientes: Dolor muy intenso. Calor. Hinchazn. Entumecimiento. Dolor a Insurance claims handler. Se puede sentir mucho dolor al tacto en la articulacin afectada. Piel brillosa, roja o morada. Grant Ruts y escalofros. La gota crnica puede provocar sntomas con ms frecuencia. Pueden verse involucradas ms articulaciones. Es posible que tambin tenga bultos blancos o amarillos (tofos) en las manos o los pies, o en otras zonas cercanas a las articulaciones. Cmo se trata? El [place] de un ataque agudo puede incluir medicamentos para Chief Technology Officer y la hinchazn, tales como: Antiinflamatorios no esteroideos (AINE), como el ibuprofeno. Corticoesteroides por boca o inyectados en una articulacin. Colchicina. Este frmaco puede administrarse por boca o a travs de un tubo (catter) intravenoso. El tratamiento para prevenir futuros ataques puede incluir lo siguiente: El uso diario de dosis bajas de antiinflamatorios no esteroideos (AINE) o Corporate treasurer. Usar un medicamento que reduce los niveles de cido rico en la sangre, como el alopurinol. Realizar cambios en la dieta. Es posible que deba consultar a un Paramedic (nutricionista) acerca de lo que debe comer y beber para Cytogeneticist. Siga estas indicaciones en su casa: Durante una crisis de gota  Si se lo indican, aplique hielo sobre la zona dolorida. Para hacer esto: Ponga el hielo en una bolsa plstica. Coloque una toalla entre la piel y Copy. Aplique el hielo durante 20 minutos, 2 a 3 veces por da. Retire el hielo si la piel se le pone de color rojo brillante. Esto es [employer]. Si no puede sentir dolor, calor o fro, tiene un  mayor riesgo de que se dae la zona. Levante la articulacin dolorida por encima del nivel del corazn tan frecuentemente como sea  posible. Haga reposo todo el tiempo que pueda. Si la articulacin est en la pierna, quiz deba usar muletas. Siga las indicaciones del mdico respecto de lo que no puede comer o beber. Cmo evitar las crisis de gota en el futuro Siga una dieta con bajo contenido de purinas. Evite el consumo de alimentos y bebidas como los siguientes: Hgado. Rin. Anchoas. Esprragos. Arenque. Hongos. Mejillones. Cerveza. Mantenga un peso saludable. Si desea adelgazar, hable con el mdico. No baje de peso demasiado rpido. Comience o contine con un plan de ejercicios como se lo haya indicado el mdico. Comida y bebida Evite las bebidas endulzadas con fructosa. Beba suficiente lquido para Radio producer pis (la orina) de color amarillo plido. Si bebe alcohol: Limite la cantidad que bebe a lo siguiente: De 0 a 1 medida por da para las mujeres que no estn embarazadas. De 0 a 2 medidas por da para los hombres. Sepa cunta cantidad de alcohol hay en las bebidas. En los [address], una medida equivale a una botella de cerveza de 12 oz (355 ml), un vaso de vino de 5 oz (148 ml) o un vaso de una bebida alcohlica de alta graduacin de 1 oz (44 ml). Indicaciones generales Use los medicamentos de venta libre y los recetados solamente como se lo haya indicado el mdico. Pregunte al mdico si debe evitar conducir o Chemical engineer mquinas mientras toma los medicamentos. Retome sus actividades normales cuando el mdico le diga que es seguro. Concurra a todas las visitas de seguimiento. Dnde obtener ms informacin [employer] of Health (Institutos Nacionales de la Salud: [email or web address]://www.myers.net/ Comunquese con un mdico si: Tiene otra crisis de gota. Sigue teniendo sntomas de [place] crisis de gota despus de [address] de [place]. Tiene problemas (efectos secundarios) debido a los medicamentos. Tiene escalofros o fiebre. Tiene sensacin de [employer] al [employer]. Siente dolor en la parte inferior de la  espalda o en el abdomen. Solicite ayuda de inmediato si: Nurse, adult. No es posible Human resources officer. No puede hacer pis. Resumen La gota es una hinchazn dolorosa de las articulaciones. El lugar ms habitual donde se presenta el dolor es el dedo gordo del pie, pero tambin pueden verse afectadas otras articulaciones. Los medicamentos y evitar algunos alimentos pueden ayudar a prevenir y tratar las crisis de [place]. Esta informacin no tiene Theme park manager el consejo del mdico. Asegrese de hacerle al mdico cualquier pregunta que tenga. Document Revised: 01/31/2021 Document Reviewed: 01/31/2021 Elsevier Patient Education  2024 [employer].

## 2022-12-29 NOTE — Progress Notes (Signed)
   Subjective:    Patient ID: Drew Estrada, male    DOB: 01-01-1966, 57 y.o.   MRN: [id number]  DOS:  12/29/2022 Type of visit - description: acute  His main concern is ankle swelling. It happens on and off, can be right or left. He typically wakes up with a swollen ankle that hurts whenever he tries to walk. He does not recall if the area is red, denies any warmness.  Also, 2 days ago had a transient pain located on the lateral aspect of the left hip. No fever or chill.  No injury. Denies any LUTS  Review of Systems See above   Past Medical History:  Diagnosis Date   ED (erectile dysfunction)    GERD (gastroesophageal reflux disease)    H/O colonoscopy 2007    for blood in stools: neg except for int. hemorrhoids     Past Surgical History:  Procedure Laterality Date   BUNIONECTOMY Right ~2011   has a screw   COLONOSCOPY     INCISION AND DRAINAGE PERIRECTAL ABSCESS  12-2007    Current Outpatient Medications  Medication Instructions   diclofenac Sodium (VOLTAREN) 4 g, Topical, 4 times daily   FIBER PO 1 tablet, Oral, Daily   ibuprofen (ADVIL) 200 mg, Oral, Every 6 hours PRN   ibuprofen (ADVIL,MOTRIN) 800 MG tablet No dose, route, or frequency recorded.   multivitamin (ONE-A-DAY MEN'S) TABS tablet 1 tablet, Oral, Daily   omeprazole (PRILOSEC OTC) 20 mg, Daily   sildenafil (VIAGRA) 100 MG tablet TAKE 1/2 TO 1 TABLET BY MOUTH DAILY AS NEEDED FOR ERECTILE DYSFUNCTION       Objective:   Physical Exam BP 132/84   Pulse 70   Temp 98.1 F (36.7 C) (Oral)   Resp 16   Ht 5' 3.25" (1.607 m)   Wt 197 lb 4 oz (89.5 kg)   SpO2 98%   BMI 34.67 kg/m  General:   Well developed, NAD, BMI noted. HEENT:  Normocephalic . Face symmetric, atraumatic MSK: Hip rotation normal.  No tender at the trochanteric bursa's. Ankles: Normal bilaterally. Great toes: With well-healed surgical scars from previous bunionectomy.  Skin: Not pale. Not jaundice Neurologic:  alert & oriented  X3.  Speech normal, gait appropriate for age and unassisted Psych--  Cognition and judgment appear intact.  Cooperative with normal attention span and concentration.  Behavior appropriate. No anxious or depressed appearing.      Assessment     Assessment  GERD Hemorrhoids.  Colonoscopy 03/2018, 2 polyps, + internal hemorrhoids.  ED (viagra prn) Strong FH DM  (7 of 11 brothers) Increased LFTs: -[place] 02-2020: Fatty liver, GB stones  -Hep B, hep C: Negative -Saw GI 12-2021.  F/u rx : 6 months  PLAN Ankle pain: Has episodes of right or left ankle swelling with pain.  Today denies pain and physical exam is normal. This is possible gout, information about this condition provided, recommend to come back to the office the same day the symptoms develop to secure a diagnosis. Left hip pain: As described above, exam negative.  We agreed on observation Vaccine advice provided

## 2023-04-19 ENCOUNTER — Encounter: Payer: Self-pay | Admitting: Internal Medicine

## 2023-04-20 ENCOUNTER — Encounter: Payer: Managed Care, Other (non HMO) | Admitting: Internal Medicine

## 2023-06-15 ENCOUNTER — Encounter (HOSPITAL_BASED_OUTPATIENT_CLINIC_OR_DEPARTMENT_OTHER): Payer: Self-pay | Admitting: Family Medicine

## 2023-06-15 ENCOUNTER — Ambulatory Visit (HOSPITAL_BASED_OUTPATIENT_CLINIC_OR_DEPARTMENT_OTHER): Payer: Managed Care, Other (non HMO) | Admitting: Family Medicine

## 2023-06-15 VITALS — BP 122/76 | HR 76 | Temp 98.1°F | Ht 64.0 in | Wt 198.0 lb

## 2023-06-15 DIAGNOSIS — E66811 Obesity, class 1: Secondary | ICD-10-CM | POA: Diagnosis not present

## 2023-06-15 DIAGNOSIS — K760 Fatty (change of) liver, not elsewhere classified: Secondary | ICD-10-CM | POA: Diagnosis not present

## 2023-06-15 DIAGNOSIS — Z0000 Encounter for general adult medical examination without abnormal findings: Secondary | ICD-10-CM | POA: Diagnosis not present

## 2023-06-15 DIAGNOSIS — N529 Male erectile dysfunction, unspecified: Secondary | ICD-10-CM

## 2023-06-15 DIAGNOSIS — Z125 Encounter for screening for malignant neoplasm of prostate: Secondary | ICD-10-CM

## 2023-06-15 MED ORDER — MELOXICAM 15 MG PO TABS: 15.0000 mg | ORAL_TABLET | Freq: Every day | ORAL | 1 refills | Status: DC

## 2023-06-15 MED ORDER — SILDENAFIL CITRATE 100 MG PO TABS: ORAL_TABLET | ORAL | 3 refills | Status: DC

## 2023-06-15 MED ORDER — SEMAGLUTIDE-WEIGHT MANAGEMENT 0.25 MG/0.5ML ~~LOC~~ SOAJ
0.2500 mg | SUBCUTANEOUS | 1 refills | Status: DC
Start: 2023-06-15 — End: 2023-10-18

## 2023-06-15 NOTE — Assessment & Plan Note (Signed)
 Has a history of mildly elevated liver enzymes with prior testing including screening for hepatitis B and hepatitis C being negative.  Imaging of liver has revealed structural findings suggestive of fatty liver disease.  Patient is aware of this diagnosis.  Has been working on lifestyle modifications Again reviewed diagnosis with patient and recommendation related to lifestyle modifications, working towards gradual weight loss

## 2023-06-15 NOTE — Progress Notes (Signed)
 New Patient Office Visit  Subjective   Patient ID: Drew Estrada, male    DOB: 01-08-1966  Age: 58 y.o. MRN: [id number]  CC:  Chief Complaint  Patient presents with   Knee Pain    Been having right knee pain for about 1 month. Hurts when trying to stand up. Using cane. Has orthopedic appt tomorrow. X-ray was normal. Might do MRI.   Establish Care    Here to establish care.    Medication Refill    Would like refill on viagra & meloxicam.     HPI Drew Estrada presents to establish care Last PCP - Dr. Drue Novel  Patient reports that he has been having ongoing right knee pain and did recently go to local urgent care and had x-rays completed.  He reports that x-rays were normal.  He does have appointment scheduled with orthopedic surgeon tomorrow, thinks that it is with [facility] at [facility] location.  Denies significant past medical history.  On chart review, prior documentation from visits with his last PCP indicates history of fatty liver disease, erectile dysfunction, GERD. He does have strong family history of diabetes.  Prior hemoglobin A1c for patient has been normal.  Patient is originally from [place], has lived here for 30 years. 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 He enjoys spending time with his wife, dogs, watching TV, go to movies.  Outpatient Encounter Medications as of 06/15/2023  Medication Sig   acetaminophen (TYLENOL) 500 MG tablet Take 325 mg by mouth as needed.   diclofenac Sodium (VOLTAREN) 1 % GEL Apply 4 g topically 4 (four) times daily. (Patient taking differently: Apply 2 g topically as needed.)   ibuprofen (ADVIL) 200 MG tablet Take 200 mg by mouth every 6 (six) hours as needed for moderate pain.   omeprazole (PRILOSEC OTC) 20 MG tablet Take 20 mg by mouth daily.   Semaglutide-Weight Management 0.25 MG/0.5ML SOAJ Inject 0.25 mg into the skin once a week.   [DISCONTINUED] sildenafil (VIAGRA) 100 MG tablet TAKE 1/2 TO 1 TABLET BY MOUTH DAILY AS NEEDED  FOR ERECTILE DYSFUNCTION   meloxicam (MOBIC) 15 MG tablet Take 1 tablet (15 mg total) by mouth daily.   multivitamin (ONE-A-DAY MEN'S) TABS tablet Take 1 tablet by mouth daily. (Patient not taking: Reported on 06/15/2023)   sildenafil (VIAGRA) 100 MG tablet TAKE 1/2 TO 1 TABLET BY MOUTH DAILY AS NEEDED FOR ERECTILE DYSFUNCTION   [DISCONTINUED] FIBER PO Take 1 tablet by mouth daily. (Patient not taking: Reported on 06/15/2023)   [DISCONTINUED] ibuprofen (ADVIL,MOTRIN) 800 MG tablet  (Patient not taking: Reported on 06/15/2023)   [DISCONTINUED] meloxicam (MOBIC) 15 MG tablet Take 15 mg by mouth daily. (Patient not taking: Reported on 06/15/2023)   No facility-administered encounter medications on file as of 06/15/2023.    Past Medical History:  Diagnosis Date   ED (erectile dysfunction)    GERD (gastroesophageal reflux disease)    H/O colonoscopy 2007    for blood in stools: neg except for int. hemorrhoids     Past Surgical History:  Procedure Laterality Date   BUNIONECTOMY Right ~2011   has a screw   COLONOSCOPY     INCISION AND DRAINAGE PERIRECTAL ABSCESS  12-2007    Family History  Problem Relation Age of Onset   Diabetes Mother    Diabetes Brother        7 of 24 brothers have DM   Lung cancer Brother 40   Colon cancer Neg Hx  Prostate cancer Neg Hx    CAD Neg Hx    Stroke Neg Hx    Esophageal cancer Neg Hx     Social History   Socioeconomic History   Marital status: Married    Spouse name: Not on file   Number of children: 1   Years of education: Not on file   Highest education level: Not on file  Occupational History   Occupation: restaurant business   Tobacco Use   Smoking status: Light Smoker    Types: Cigars    Passive exposure: Current   Smokeless tobacco: Never   Tobacco comments:    Smokes cigars  Vaping Use   Vaping status: Never Used  Substance and Sexual Activity   Alcohol use: Yes    Comment: Wine 1-2 glasses daily   Drug use: Yes    Types:  Cocaine   Sexual activity: Not on file  Other Topics Concern   Not on file  Social History Narrative   Daughter passed 2023.   Social Drivers of Corporate investment banker Strain: Not on file  Food Insecurity: No Food Insecurity (06/15/2023)   Hunger Vital Sign    Worried About Running Out of Food in the Last Year: Never true    Ran Out of Food in the Last Year: Never true  Transportation Needs: No Transportation Needs (06/15/2023)   PRAPARE - Administrator, Civil Service (Medical): No    Lack of Transportation (Non-Medical): No  Physical Activity: Not on file  Stress: Not on file  Social Connections: Not on file  Intimate Partner Violence: Not At Risk (06/15/2023)   Humiliation, Afraid, Rape, and Kick questionnaire    Fear of Current or Ex-Partner: No    Emotionally Abused: No    Physically Abused: No    Sexually Abused: No    Objective   BP 122/76 (BP Location: Left Arm, Patient Position: Sitting, Cuff Size: Normal)   Pulse 76   Temp 98.1 F (36.7 C) (Oral)   Ht 5\' 4"  (1.626 m)   Wt 198 lb (89.8 kg)   SpO2 98%   BMI 33.99 kg/m   Physical Exam  58 year old male in no acute distress Cardiovascular exam with regular rate and rhythm Lungs clear to auscultation bilaterally  Assessment & Plan:   Erectile dysfunction, unspecified erectile dysfunction type Assessment & Plan: Requesting refill of Viagra today.  Has not had any issues with medication. Refill provided today.   Fatty liver Assessment & Plan: Has a history of mildly elevated liver enzymes with prior testing including screening for hepatitis B and hepatitis C being negative.  Imaging of liver has revealed structural findings suggestive of fatty liver disease.  Patient is aware of this diagnosis.  Has been working on lifestyle modifications Again reviewed diagnosis with patient and recommendation related to lifestyle modifications, working towards gradual weight loss   Obesity, Class I, BMI  30-34.9 Assessment & Plan: Long discussion today reviewing weight loss medications including injectable options including GLP-1 receptor agonist, oral agents including Contrave, orlistat, phentermine.  We discussed potential risk, benefits, cost associated with these various medications as well as the possibility of insurance coverage or lack thereof.  We discussed typical dosing regimen for both injectable and oral medications, proper administration of each.  We discussed potential outcomes with each. After discussion of potential risks and adverse reactions with these medications and potential benefits of each, patient would like to proceed with injectable GLP-1 receptor agonist if possible.  Prescription sent to pharmacy on file. Additional consideration is for referral to healthy weight and wellness clinic  Orders: -     Semaglutide-Weight Management; Inject 0.25 mg into the skin once a week.  Dispense: 2 mL; Refill: 1  Wellness examination -     CBC with Differential/Platelet; Future -     Comprehensive metabolic panel; Future -     Hemoglobin A1c; Future -     Lipid panel; Future -     TSH Rfx on Abnormal to Free T4; Future -     PSA Total (Reflex To Free); Future  Prostate cancer screening -     PSA Total (Reflex To Free); Future  Other orders -     Sildenafil Citrate; TAKE 1/2 TO 1 TABLET BY MOUTH DAILY AS NEEDED FOR ERECTILE DYSFUNCTION  Dispense: 10 tablet; Refill: 3 -     Meloxicam; Take 1 tablet (15 mg total) by mouth daily.  Dispense: 30 tablet; Refill: 1  Return in about 1 month (around 07/13/2023) for CPE with fasting labs 1 week prior.    ___________________________________________ Jadaya Sommerfield de Peru, MD, ABFM, CAQSM Primary Care and Sports Medicine [facility]

## 2023-06-15 NOTE — Patient Instructions (Signed)
  Medication Instructions:  Your physician recommends that you continue on your current medications as directed. Please refer to the Current Medication list given to you today. --If you need a refill on any your medications before your next appointment, please call your pharmacy first. If no refills are authorized on file call the office.-- Lab Work: Your physician has recommended that you have lab work 1 week before your physical.  If you have labs (blood work) drawn today and your tests are completely normal, you will receive your results via MyChart message OR a phone call from our staff.  Please ensure you check your voicemail in the event that you authorized detailed messages to be left on a delegated number. If you have any lab test that is abnormal or we need to change your treatment, we will call you to review the results.   Follow-Up: Your next appointment:   Your physician recommends that you schedule a follow-up appointment in: 2 months for physical with Dr. de Peru  You will receive a text message or e-mail with a link to a survey about your care and experience with [place] today! We would greatly appreciate your feedback!   Thanks for letting us be apart of your health journey!!  Primary Care and Sports Medicine   Dr. Ceasar Mons Peru   We encourage you to activate your patient portal called "MyChart".  Sign up information is provided on this After Visit Summary.  MyChart is used to connect with patients for Virtual Visits (Telemedicine).  Patients are able to view lab/test results, encounter notes, upcoming appointments, etc.  Non-urgent messages can be sent to your provider as well. To learn more about what you can do with MyChart, please visit --  [email or web address].

## 2023-06-15 NOTE — Assessment & Plan Note (Signed)
 Long discussion today reviewing weight loss medications including injectable options including GLP-1 receptor agonist, oral agents including Contrave, orlistat, phentermine.  We discussed potential risk, benefits, cost associated with these various medications as well as the possibility of insurance coverage or lack thereof.  We discussed typical dosing regimen for both injectable and oral medications, proper administration of each.  We discussed potential outcomes with each. After discussion of potential risks and adverse reactions with these medications and potential benefits of each, patient would like to proceed with injectable GLP-1 receptor agonist if possible.  Prescription sent to pharmacy on file. Additional consideration is for referral to healthy weight and wellness clinic

## 2023-06-15 NOTE — Assessment & Plan Note (Addendum)
 Requesting refill of Viagra today.  Has not had any issues with medication. Refill provided today.

## 2023-06-17 ENCOUNTER — Other Ambulatory Visit (HOSPITAL_BASED_OUTPATIENT_CLINIC_OR_DEPARTMENT_OTHER): Payer: Self-pay

## 2023-08-09 ENCOUNTER — Other Ambulatory Visit (HOSPITAL_BASED_OUTPATIENT_CLINIC_OR_DEPARTMENT_OTHER): Payer: Managed Care, Other (non HMO)

## 2023-08-16 ENCOUNTER — Encounter (HOSPITAL_BASED_OUTPATIENT_CLINIC_OR_DEPARTMENT_OTHER): Payer: Managed Care, Other (non HMO) | Admitting: Family Medicine

## 2023-08-16 ENCOUNTER — Other Ambulatory Visit (HOSPITAL_BASED_OUTPATIENT_CLINIC_OR_DEPARTMENT_OTHER): Payer: Self-pay | Admitting: *Deleted

## 2023-08-16 DIAGNOSIS — Z0000 Encounter for general adult medical examination without abnormal findings: Secondary | ICD-10-CM

## 2023-08-16 DIAGNOSIS — Z125 Encounter for screening for malignant neoplasm of prostate: Secondary | ICD-10-CM

## 2023-08-17 ENCOUNTER — Encounter (HOSPITAL_BASED_OUTPATIENT_CLINIC_OR_DEPARTMENT_OTHER): Payer: Self-pay | Admitting: Family Medicine

## 2023-08-17 LAB — COMPREHENSIVE METABOLIC PANEL WITH GFR
ALT: 41 IU/L (ref 0–44)
AST: 26 IU/L (ref 0–40)
Albumin: 4.3 g/dL (ref 3.8–4.9)
Alkaline Phosphatase: 93 IU/L (ref 44–121)
BUN/Creatinine Ratio: 17 (ref 9–20)
BUN: 17 mg/dL (ref 6–24)
Bilirubin Total: 0.5 mg/dL (ref 0.0–1.2)
CO2: 23 mmol/L (ref 20–29)
Calcium: 9.3 mg/dL (ref 8.7–10.2)
Chloride: 103 mmol/L (ref 96–106)
Creatinine, Ser: 1 mg/dL (ref 0.76–1.27)
Globulin, Total: 2.4 g/dL (ref 1.5–4.5)
Glucose: 79 mg/dL (ref 70–99)
Potassium: 4.1 mmol/L (ref 3.5–5.2)
Sodium: 141 mmol/L (ref 134–144)
Total Protein: 6.7 g/dL (ref 6.0–8.5)
eGFR: 89 mL/min/{1.73_m2} (ref >59)

## 2023-08-17 LAB — CBC WITH DIFFERENTIAL/PLATELET
Basophils Absolute: 0.1 10*3/uL (ref 0.0–0.2)
Basos: 1 %
EOS (ABSOLUTE): 0.2 10*3/uL (ref 0.0–0.4)
Eos: 1 %
Hematocrit: 49 % (ref 37.5–51.0)
Hemoglobin: 16.4 g/dL (ref 13.0–17.7)
Immature Grans (Abs): 0.1 10*3/uL (ref 0.0–0.1)
Immature Granulocytes: 1 %
Lymphocytes Absolute: 4.3 10*3/uL — ABNORMAL HIGH (ref 0.7–3.1)
Lymphs: 38 %
MCH: 31.7 pg (ref 26.6–33.0)
MCHC: 33.5 g/dL (ref 31.5–35.7)
MCV: 95 fL (ref 79–97)
Monocytes Absolute: 0.7 10*3/uL (ref 0.1–0.9)
Monocytes: 7 %
Neutrophils Absolute: 6 10*3/uL (ref 1.4–7.0)
Neutrophils: 52 %
Platelets: 195 10*3/uL (ref 150–450)
RBC: 5.17 x10E6/uL (ref 4.14–5.80)
RDW: 13 % (ref 11.6–15.4)
WBC: 11.3 10*3/uL — ABNORMAL HIGH (ref 3.4–10.8)

## 2023-08-17 LAB — LIPID PANEL
Chol/HDL Ratio: 3 ratio (ref 0.0–5.0)
Cholesterol, Total: 167 mg/dL (ref 100–199)
HDL: 55 mg/dL (ref >39)
LDL Chol Calc (NIH): 82 mg/dL (ref 0–99)
Triglycerides: 178 mg/dL — ABNORMAL HIGH (ref 0–149)
VLDL Cholesterol Cal: 30 mg/dL (ref 5–40)

## 2023-08-17 LAB — TSH RFX ON ABNORMAL TO FREE T4: TSH: 3.24 u[IU]/mL (ref 0.450–4.500)

## 2023-08-17 LAB — PSA TOTAL (REFLEX TO FREE): Prostate Specific Ag, Serum: 1.9 ng/mL (ref 0.0–4.0)

## 2023-08-17 LAB — HEMOGLOBIN A1C
Est. average glucose Bld gHb Est-mCnc: 108 mg/dL
Hgb A1c MFr Bld: 5.4 % (ref 4.8–5.6)

## 2023-09-30 DIAGNOSIS — M25561 Pain in right knee: Secondary | ICD-10-CM | POA: Insufficient documentation

## 2023-10-18 ENCOUNTER — Other Ambulatory Visit (HOSPITAL_BASED_OUTPATIENT_CLINIC_OR_DEPARTMENT_OTHER): Payer: Self-pay | Admitting: Family Medicine

## 2023-10-18 ENCOUNTER — Ambulatory Visit (HOSPITAL_BASED_OUTPATIENT_CLINIC_OR_DEPARTMENT_OTHER): Admitting: Family Medicine

## 2023-10-18 ENCOUNTER — Other Ambulatory Visit (HOSPITAL_BASED_OUTPATIENT_CLINIC_OR_DEPARTMENT_OTHER): Payer: Self-pay

## 2023-10-18 ENCOUNTER — Encounter (HOSPITAL_BASED_OUTPATIENT_CLINIC_OR_DEPARTMENT_OTHER): Payer: Self-pay | Admitting: Family Medicine

## 2023-10-18 VITALS — BP 134/86 | HR 82 | Ht 65.0 in | Wt 204.4 lb

## 2023-10-18 DIAGNOSIS — Z0000 Encounter for general adult medical examination without abnormal findings: Secondary | ICD-10-CM | POA: Insufficient documentation

## 2023-10-18 DIAGNOSIS — Z1211 Encounter for screening for malignant neoplasm of colon: Secondary | ICD-10-CM | POA: Diagnosis not present

## 2023-10-18 DIAGNOSIS — D72820 Lymphocytosis (symptomatic): Secondary | ICD-10-CM | POA: Insufficient documentation

## 2023-10-18 DIAGNOSIS — E66811 Obesity, class 1: Secondary | ICD-10-CM

## 2023-10-18 LAB — CBC WITH DIFFERENTIAL/PLATELET
Basophils Absolute: 0 10*3/uL (ref 0.0–0.2)
Basos: 1 %
EOS (ABSOLUTE): 0.2 10*3/uL (ref 0.0–0.4)
Eos: 2 %
Hematocrit: 46.7 % (ref 37.5–51.0)
Hemoglobin: 16 g/dL (ref 13.0–17.7)
Immature Grans (Abs): 0 10*3/uL (ref 0.0–0.1)
Immature Granulocytes: 0 %
Lymphocytes Absolute: 2.7 10*3/uL (ref 0.7–3.1)
Lymphs: 36 %
MCH: 31.6 pg (ref 26.6–33.0)
MCHC: 34.3 g/dL (ref 31.5–35.7)
MCV: 92 fL (ref 79–97)
Monocytes Absolute: 0.5 10*3/uL (ref 0.1–0.9)
Monocytes: 7 %
Neutrophils Absolute: 4.2 10*3/uL (ref 1.4–7.0)
Neutrophils: 54 %
Platelets: 188 10*3/uL (ref 150–450)
RBC: 5.06 x10E6/uL (ref 4.14–5.80)
RDW: 12.5 % (ref 11.6–15.4)
WBC: 7.7 10*3/uL (ref 3.4–10.8)

## 2023-10-18 MED ORDER — SEMAGLUTIDE-WEIGHT MANAGEMENT 0.25 MG/0.5ML ~~LOC~~ SOAJ
0.2500 mg | SUBCUTANEOUS | 1 refills | Status: DC
  Filled 2023-10-18: qty 2, 28d supply, fill #0
  Filled 2023-11-16: qty 2, 28d supply, fill #1

## 2023-10-18 NOTE — Assessment & Plan Note (Signed)
 We discussed considerations previously.  He initially did not proceed with medication due to insurance not covering and medication being too expensive.  He has now decided he would like to proceed with medication and will just pay out-of-pocket.  We discussed options including paying out-of-pocket at the pharmacy, also discussed self-pay option available directly through manufacture.  He will look into both options and will let us  know if he would like to proceed with self-pay option through pharmaceutical company.  Will plan to follow-up in 1 month to assess progress with whichever medication patient decides to proceed with.

## 2023-10-18 NOTE — Assessment & Plan Note (Signed)
 Routine HCM labs reviewed. HCM reviewed/discussed. Anticipatory guidance regarding healthy weight, lifestyle and choices given. Recommend healthy diet.  Recommend approximately 150 minutes/week of moderate intensity exercise Recommend regular dental and vision exams Always use seatbelt/lap and shoulder restraints Recommend using smoke alarms and checking batteries at least twice a year Recommend using sunscreen when outside Discussed colon cancer screening recommendations, options.  Patient has had prior colonoscopy, is due at this time Discussed recommendations for shingles vaccine.  Patient reports receiving in the past Discussed immunization recommendations The natural history of prostate cancer and ongoing controversy regarding screening and potential treatment outcomes of prostate cancer has been discussed with the patient. The meaning of a false positive PSA and a false negative PSA has been discussed. He indicates understanding of the limitations of this screening test and wished to proceed with screening PSA testing.

## 2023-10-18 NOTE — Assessment & Plan Note (Signed)
 Observed on recent labs, we will proceed with recheck today for monitoring

## 2023-10-18 NOTE — Patient Instructions (Signed)
  Medication Instructions:  Your physician recommends that you continue on your current medications as directed. Please refer to the Current Medication list given to you today. --If you need a refill on any your medications before your next appointment, please call your pharmacy first. If no refills are authorized on file call the office.-- Lab Work: Your physician has recommended that you have lab work today: today If you have labs (blood work) drawn today and your tests are completely normal, you will receive your results via MyChart message OR a phone call from our staff.  Please ensure you check your voicemail in the event that you authorized detailed messages to be left on a delegated number. If you have any lab test that is abnormal or we need to change your treatment, we will call you to review the results.  Follow-Up: Your next appointment:   Your physician recommends that you schedule a follow-up appointment in: 1 month after starting medication  with Dr. de [place]  You will receive a text message or e-mail with a link to a survey about your care and experience with us  today! We would greatly appreciate your feedback!   Thanks for letting us  be apart of your health journey!!  Primary Care and Sports Medicine   Dr. Quintin sheerer Peru   We encourage you to activate your patient portal called MyChart.  Sign up information is provided on this After Visit Summary.  MyChart is used to connect with patients for Virtual Visits (Telemedicine).  Patients are able to view lab/test results, encounter notes, upcoming appointments, etc.  Non-urgent messages can be sent to your provider as well. To learn more about what you can do with MyChart, please visit --  [email or web address].

## 2023-10-18 NOTE — Progress Notes (Signed)
 Subjective:    CC: Annual Physical Exam  HPI:  Drew Estrada is a 58 y.o. presenting for annual physical  I reviewed the past medical history, family history, social history, surgical history, and allergies today and no changes were needed.  Please see the problem list section below in epic for further details.  Past Medical History: Past Medical History:  Diagnosis Date   ED (erectile dysfunction)    GERD (gastroesophageal reflux disease)    H/O colonoscopy 2007    for blood in stools: neg except for int. hemorrhoids    Past Surgical History: Past Surgical History:  Procedure Laterality Date   BUNIONECTOMY Right ~2011   has a screw   COLONOSCOPY     INCISION AND DRAINAGE PERIRECTAL ABSCESS  12-2007   Social History: Social History   Socioeconomic History   Marital status: Married    Spouse name: Not on file   Number of children: 1   Years of education: Not on file   Highest education level: Not on file  Occupational History   Occupation: restaurant business   Tobacco Use   Smoking status: Light Smoker    Types: Cigars    Passive exposure: Current   Smokeless tobacco: Never   Tobacco comments:    Smokes cigars  Vaping Use   Vaping status: Never Used  Substance and Sexual Activity   Alcohol use: Yes    Comment: Wine 1-2 glasses daily   Drug use: Yes    Types: Cocaine   Sexual activity: Not on file  Other Topics Concern   Not on file  Social History Narrative   Daughter passed 2023.   Social Drivers of Corporate investment banker Strain: Not on file  Food Insecurity: No Food Insecurity (06/15/2023)   Hunger Vital Sign    Worried About Running Out of Food in the Last Year: Never true    Ran Out of Food in the Last Year: Never true  Transportation Needs: No Transportation Needs (06/15/2023)   PRAPARE - Administrator, Civil Service (Medical): No    Lack of Transportation (Non-Medical): No  Physical Activity: Not on file  Stress: Not on file   Social Connections: Not on file   Family History: Family History  Problem Relation Age of Onset   Diabetes Mother    Diabetes Brother        7 of 35 brothers have DM   Lung cancer Brother 67   Colon cancer Neg Hx    Prostate cancer Neg Hx    CAD Neg Hx    Stroke Neg Hx    Esophageal cancer Neg Hx    Allergies: Allergies  Allergen Reactions   Codeine Sulfate Nausea Only   Medications: See med rec.  Review of Systems: No headache, visual changes, nausea, vomiting, diarrhea, constipation, dizziness, abdominal pain, skin rash, fevers, chills, night sweats, swollen lymph nodes, weight loss, chest pain, body aches, joint swelling, muscle aches, shortness of breath, mood changes, visual or auditory hallucinations.  Objective:    BP 134/86 (BP Location: Right Arm, Patient Position: Sitting, Cuff Size: Normal)   Pulse 82   Ht 5' 5 (1.651 m)   Wt 204 lb 6.4 oz (92.7 kg)   SpO2 96%   BMI 34.01 kg/m   General: Well Developed, well nourished, and in no acute distress. Neuro: Alert and oriented x3, extra-ocular muscles intact, sensation grossly intact. Cranial nerves II through XII are intact, motor, sensory, and coordinative functions are  all intact. HEENT: Normocephalic, atraumatic, pupils equal round reactive to light, neck supple, no masses, no lymphadenopathy, thyroid nonpalpable. Oropharynx, nasopharynx, external ear canals are unremarkable. Skin: Warm and dry, no rashes noted. Cardiac: Regular rate and rhythm, no murmurs rubs or gallops. Respiratory: Clear to auscultation bilaterally. Not using accessory muscles, speaking in full sentences. Abdominal: Soft, nontender, nondistended, positive bowel sounds, no masses, no organomegaly. Musculoskeletal: Shoulder, elbow, wrist, hip, knee, ankle stable, and with full range of motion.  Impression and Recommendations:    Wellness examination Assessment & Plan: Routine HCM labs reviewed. HCM reviewed/discussed. Anticipatory guidance  regarding healthy weight, lifestyle and choices given. Recommend healthy diet.  Recommend approximately 150 minutes/week of moderate intensity exercise Recommend regular dental and vision exams Always use seatbelt/lap and shoulder restraints Recommend using smoke alarms and checking batteries at least twice a year Recommend using sunscreen when outside Discussed colon cancer screening recommendations, options.  Patient has had prior colonoscopy, is due at this time Discussed recommendations for shingles vaccine.  Patient reports receiving in the past Discussed immunization recommendations The natural history of prostate cancer and ongoing controversy regarding screening and potential treatment outcomes of prostate cancer has been discussed with the patient. The meaning of a false positive PSA and a false negative PSA has been discussed. He indicates understanding of the limitations of this screening test and wished to proceed with screening PSA testing.   Obesity, Class I, BMI 30-34.9 Assessment & Plan: We discussed considerations previously.  He initially did not proceed with medication due to insurance not covering and medication being too expensive.  He has now decided he would like to proceed with medication and will just pay out-of-pocket.  We discussed options including paying out-of-pocket at the pharmacy, also discussed self-pay option available directly through manufacture.  He will look into both options and will let us  know if he would like to proceed with self-pay option through pharmaceutical company.  Will plan to follow-up in 1 month to assess progress with whichever medication patient decides to proceed with.  Orders: -     Semaglutide -Weight Management; Inject 0.25 mg into the skin once a week.  Dispense: 2 mL; Refill: 1  Lymphocytosis Assessment & Plan: Observed on recent labs, we will proceed with recheck today for monitoring  Orders: -     CBC with Differential/Platelet;  Future  Colon cancer screening -     Ambulatory referral to Gastroenterology  Return in about 4 weeks (around 11/15/2023) for med check.   ___________________________________________ Drew Justen de [place], MD, ABFM, CAQSM Primary Care and Sports Medicine [facility]

## 2023-10-20 ENCOUNTER — Encounter (HOSPITAL_BASED_OUTPATIENT_CLINIC_OR_DEPARTMENT_OTHER): Admitting: Family Medicine

## 2023-10-21 ENCOUNTER — Other Ambulatory Visit (HOSPITAL_BASED_OUTPATIENT_CLINIC_OR_DEPARTMENT_OTHER): Payer: Self-pay

## 2023-10-23 ENCOUNTER — Other Ambulatory Visit (HOSPITAL_BASED_OUTPATIENT_CLINIC_OR_DEPARTMENT_OTHER): Payer: Self-pay | Admitting: Family Medicine

## 2023-11-09 ENCOUNTER — Ambulatory Visit (HOSPITAL_BASED_OUTPATIENT_CLINIC_OR_DEPARTMENT_OTHER): Payer: Self-pay | Admitting: Family Medicine

## 2023-11-12 ENCOUNTER — Other Ambulatory Visit (HOSPITAL_BASED_OUTPATIENT_CLINIC_OR_DEPARTMENT_OTHER): Payer: Self-pay

## 2023-11-16 ENCOUNTER — Other Ambulatory Visit (HOSPITAL_BASED_OUTPATIENT_CLINIC_OR_DEPARTMENT_OTHER): Payer: Self-pay

## 2023-11-24 HISTORY — PX: KNEE ARTHROPLASTY: SHX992

## 2023-11-26 ENCOUNTER — Other Ambulatory Visit (HOSPITAL_BASED_OUTPATIENT_CLINIC_OR_DEPARTMENT_OTHER): Payer: Self-pay | Admitting: Family Medicine

## 2023-12-07 ENCOUNTER — Encounter (HOSPITAL_BASED_OUTPATIENT_CLINIC_OR_DEPARTMENT_OTHER): Payer: Self-pay | Admitting: Family Medicine

## 2023-12-07 ENCOUNTER — Other Ambulatory Visit (HOSPITAL_BASED_OUTPATIENT_CLINIC_OR_DEPARTMENT_OTHER): Payer: Self-pay

## 2023-12-07 ENCOUNTER — Ambulatory Visit (HOSPITAL_BASED_OUTPATIENT_CLINIC_OR_DEPARTMENT_OTHER): Admitting: Family Medicine

## 2023-12-07 VITALS — BP 116/77 | HR 86 | Temp 97.6°F | Resp 95 | Ht 65.0 in | Wt 200.0 lb

## 2023-12-07 DIAGNOSIS — E66811 Obesity, class 1: Secondary | ICD-10-CM | POA: Diagnosis not present

## 2023-12-07 MED ORDER — SEMAGLUTIDE-WEIGHT MANAGEMENT 0.5 MG/0.5ML ~~LOC~~ SOAJ
0.5000 mg | SUBCUTANEOUS | 1 refills | Status: DC
  Filled 2023-12-07 – 2023-12-09 (×2): qty 2, 28d supply, fill #0
  Filled 2024-01-01 – 2024-01-13 (×2): qty 2, 28d supply, fill #1

## 2023-12-07 NOTE — Assessment & Plan Note (Signed)
 Patient reports that he was able to start with Wegovy .  Denies any issues with medication.  Has been tolerating well, no reported side effects.  He feels that he has lost weight since starting the medication. Weight prior to starting medication: 204 pounds Current weight: 200 pounds Some progress thus far with weight loss, however patient still on very low-dose of medication.  Given that he has been tolerating it, we discussed consideration for titrating dose of medication, patient interested in this, prescription for higher dosage sent to pharmacy We will plan to follow-up in about 2 months to assess progress.  Did advise that if he is doing well with new dosage of medication, he can send us  a message towards the end of month supply with new dosage and we can look to increase medication dosage.  Advised that if having any issues with medication, recommend returning to office sooner

## 2023-12-07 NOTE — Progress Notes (Signed)
    Procedures performed today:    None.  Independent interpretation of notes and tests performed by another provider:   None.  Brief History, Exam, Impression, and Recommendations:    BP 116/77   Pulse 86   Temp 97.6 F (36.4 C) (Oral)   Resp (!) 95   Ht 5' 5 (1.651 m)   Wt 200 lb (90.7 kg)   HC 18 (45.7 cm)   BMI 33.28 kg/m   Obesity, Class I, BMI 30-34.9 Assessment & Plan: Patient reports that he was able to start with Wegovy .  Denies any issues with medication.  Has been tolerating well, no reported side effects.  He feels that he has lost weight since starting the medication. Weight prior to starting medication: 204 pounds Current weight: 200 pounds Some progress thus far with weight loss, however patient still on very low-dose of medication.  Given that he has been tolerating it, we discussed consideration for titrating dose of medication, patient interested in this, prescription for higher dosage sent to pharmacy We will plan to follow-up in about 2 months to assess progress.  Did advise that if he is doing well with new dosage of medication, he can send us  a message towards the end of month supply with new dosage and we can look to increase medication dosage.  Advised that if having any issues with medication, recommend returning to office sooner  Orders: -     Semaglutide -Weight Management; Inject 0.5 mg into the skin once a week.  Dispense: 2 mL; Refill: 1  Return in about 2 months (around 02/06/2024) for med check.   ___________________________________________ Christe Tellez de [place], MD, ABFM, CAQSM Primary Care and Sports Medicine [facility]

## 2023-12-09 ENCOUNTER — Other Ambulatory Visit (HOSPITAL_BASED_OUTPATIENT_CLINIC_OR_DEPARTMENT_OTHER): Payer: Self-pay

## 2023-12-22 ENCOUNTER — Encounter: Payer: Self-pay | Admitting: Gastroenterology

## 2024-01-01 ENCOUNTER — Other Ambulatory Visit (HOSPITAL_BASED_OUTPATIENT_CLINIC_OR_DEPARTMENT_OTHER): Payer: Self-pay

## 2024-01-13 ENCOUNTER — Other Ambulatory Visit (HOSPITAL_BASED_OUTPATIENT_CLINIC_OR_DEPARTMENT_OTHER): Payer: Self-pay

## 2024-02-03 ENCOUNTER — Ambulatory Visit (AMBULATORY_SURGERY_CENTER)

## 2024-02-03 VITALS — Ht 65.0 in | Wt 188.0 lb

## 2024-02-03 DIAGNOSIS — Z860100 Personal history of colon polyps, unspecified: Secondary | ICD-10-CM

## 2024-02-03 MED ORDER — NA SULFATE-K SULFATE-MG SULF 17.5-3.13-1.6 GM/177ML PO SOLN: 1.0000 | Freq: Once | ORAL | 0 refills | Status: AC

## 2024-02-03 NOTE — Progress Notes (Signed)
 No issues known to pt with past sedation with any surgeries or procedures Patient denies ever being told they had issues or difficulty with intubation  No FH of Malignant Hyperthermia Pt is not on diet pills; is taking GLP-1 medication Pt is not on home 02  Pt is not on blood thinners  Pt denies issues with chronic constipation  No A fib or A flutter Have any cardiac testing pending--no Pt instructed to use [email or web address] or GoodRx for a price reduction on prep  Ambulates independently

## 2024-02-07 ENCOUNTER — Other Ambulatory Visit (HOSPITAL_BASED_OUTPATIENT_CLINIC_OR_DEPARTMENT_OTHER): Payer: Self-pay

## 2024-02-07 ENCOUNTER — Ambulatory Visit (HOSPITAL_BASED_OUTPATIENT_CLINIC_OR_DEPARTMENT_OTHER): Admitting: Family Medicine

## 2024-02-07 ENCOUNTER — Encounter (HOSPITAL_BASED_OUTPATIENT_CLINIC_OR_DEPARTMENT_OTHER): Payer: Self-pay | Admitting: Family Medicine

## 2024-02-07 VITALS — BP 117/84 | HR 100 | Ht 65.0 in | Wt 194.0 lb

## 2024-02-07 DIAGNOSIS — E66811 Obesity, class 1: Secondary | ICD-10-CM

## 2024-02-07 MED ORDER — SEMAGLUTIDE-WEIGHT MANAGEMENT 1 MG/0.5ML ~~LOC~~ SOAJ
1.0000 mg | SUBCUTANEOUS | 1 refills | Status: DC
  Filled 2024-02-07: qty 2, 28d supply, fill #0
  Filled 2024-03-31: qty 2, 28d supply, fill #1

## 2024-02-07 NOTE — Progress Notes (Signed)
    Procedures performed today:    None.  Independent interpretation of notes and tests performed by another provider:   None.  Brief History, Exam, Impression, and Recommendations:    BP 117/84 (BP Location: Right Arm, Patient Position: Sitting, Cuff Size: Large)   Pulse 100   Ht 5' 5 (1.651 m)   Wt 194 lb (88 kg)   SpO2 96%   BMI 32.28 kg/m   Obesity, Class I, BMI 30-34.9 Assessment & Plan: Patient continues with Wegovy .  Denies any issues with medication.  Has been tolerating well, no reported side effects.  He feels that he has lost weight since starting the medication. Weight prior to starting medication: 204 pounds Current weight: 194 pounds Continues to have some progress so far with weight loss.  Given that he has been tolerating current dose, we discussed consideration for titrating dose of medication, patient interested in this, prescription for higher dosage sent to pharmacy We will plan to follow-up in about 2 months to assess progress.  Did advise that if he is doing well with new dosage of medication, he can send us  a message towards the end of month supply with new dosage and we can look to increase medication dosage.  Advised that if having any issues with medication, recommend returning to office sooner  Orders: -     Semaglutide -Weight Management; Inject 1 mg into the skin once a week.  Dispense: 2 mL; Refill: 1  Patient does have questions today (orthotics.  He does better custom orthotics which he says he has had for almost 15 years and wonders about getting set up with new ones. We discussed considerations related to providers that would typically provide custom orthotics.  He does see the orthopedic specialist through [facility]  Discussed that he could inquire about meeting with foot and ankle specialist through [facility].  Another consideration is that we can place referral to get set up with podiatrist.  If he does need us  to place referral, he will reach  out and let us  know.  Return in about 2 months (around 04/08/2024) for med check.   ___________________________________________ Drew Lawes de [place], MD, ABFM, [facility] Primary Care and Sports Medicine [facility]

## 2024-02-07 NOTE — Assessment & Plan Note (Signed)
 Patient continues with Wegovy .  Denies any issues with medication.  Has been tolerating well, no reported side effects.  He feels that he has lost weight since starting the medication. Weight prior to starting medication: 204 pounds Current weight: 194 pounds Continues to have some progress so far with weight loss.  Given that he has been tolerating current dose, we discussed consideration for titrating dose of medication, patient interested in this, prescription for higher dosage sent to pharmacy We will plan to follow-up in about 2 months to assess progress.  Did advise that if he is doing well with new dosage of medication, he can send us  a message towards the end of month supply with new dosage and we can look to increase medication dosage.  Advised that if having any issues with medication, recommend returning to office sooner

## 2024-02-07 NOTE — Patient Instructions (Signed)
  Medication Instructions:  Your physician recommends that you continue on your current medications as directed. Please refer to the Current Medication list given to you today. --If you need a refill on any your medications before your next appointment, please call your pharmacy first. If no refills are authorized on file call the office.--  Follow-Up: Your next appointment:   Your physician recommends that you schedule a follow-up appointment in: 2 months follow up with Dr. de [place]  You will receive a text message or e-mail with a link to a survey about your care and experience with [place] today! We would greatly appreciate your feedback!   Thanks for letting us be apart of your health journey!!  Primary Care and Sports Medicine   Dr. Ceasar Mons [place]   We encourage you to activate your patient portal called "MyChart".  Sign up information is provided on this After Visit Summary.  MyChart is used to connect with patients for Virtual Visits (Telemedicine).  Patients are able to view lab/test results, encounter notes, upcoming appointments, etc.  Non-urgent messages can be sent to your provider as well. To learn more about what you can do with MyChart, please visit --  [email or web address].

## 2024-02-10 ENCOUNTER — Encounter: Payer: Self-pay | Admitting: Gastroenterology

## 2024-02-15 ENCOUNTER — Telehealth: Payer: Self-pay | Admitting: Gastroenterology

## 2024-02-15 NOTE — Telephone Encounter (Signed)
 Patient states he has a cold and would like to know if he is able to continue with 10/23 colonoscopy. Please advise, thank you

## 2024-02-15 NOTE — Telephone Encounter (Signed)
 Spoke with patient. No fever, cough, SOB, or GI symptoms just reports feeling achy and tired. Pt is ok to proceed as of now. He verbalizes that he feels ok to be able to drink his prep solution. He is aware if her develops any of the symptoms above he will need to call back so he can be r/s.

## 2024-02-15 NOTE — Telephone Encounter (Signed)
 Good afternoon Dr. Stacia,   I received a call from this patient stating that he is having flu like symptoms and has developed a fever. Patient was schedule for October the 23 rd due to patient requesting to reschedule he is now scheduled for November the 10 th at 12:30 PM. Please advise.   Thank you.

## 2024-02-17 ENCOUNTER — Other Ambulatory Visit (HOSPITAL_BASED_OUTPATIENT_CLINIC_OR_DEPARTMENT_OTHER): Payer: Self-pay

## 2024-02-17 ENCOUNTER — Encounter: Admitting: Gastroenterology

## 2024-03-06 ENCOUNTER — Encounter: Payer: Self-pay | Admitting: Gastroenterology

## 2024-03-06 ENCOUNTER — Ambulatory Visit (AMBULATORY_SURGERY_CENTER): Admitting: Gastroenterology

## 2024-03-06 VITALS — BP 113/74 | HR 86 | Temp 98.4°F | Resp 19 | Ht 65.0 in | Wt 188.0 lb

## 2024-03-06 DIAGNOSIS — K640 First degree hemorrhoids: Secondary | ICD-10-CM

## 2024-03-06 DIAGNOSIS — Z860100 Personal history of colon polyps, unspecified: Secondary | ICD-10-CM

## 2024-03-06 DIAGNOSIS — Z860101 Personal history of adenomatous and serrated colon polyps: Secondary | ICD-10-CM | POA: Diagnosis not present

## 2024-03-06 DIAGNOSIS — Z1211 Encounter for screening for malignant neoplasm of colon: Secondary | ICD-10-CM

## 2024-03-06 DIAGNOSIS — D123 Benign neoplasm of transverse colon: Secondary | ICD-10-CM

## 2024-03-06 DIAGNOSIS — K5730 Diverticulosis of large intestine without perforation or abscess without bleeding: Secondary | ICD-10-CM | POA: Diagnosis not present

## 2024-03-06 DIAGNOSIS — D120 Benign neoplasm of cecum: Secondary | ICD-10-CM

## 2024-03-06 MED ORDER — SODIUM CHLORIDE 0.9 % IV SOLN: 500.0000 mL | Freq: Once | INTRAVENOUS | Status: DC

## 2024-03-06 NOTE — Op Note (Signed)
 [facility] Patient Name: Drew Estrada Procedure Date: 03/06/2024 1:10 PM MRN: [id number] Endoscopist: Glendia E. Stacia , MD, [id number] Age: 58 Referring MD:  Date of Birth: Oct 30, 1965 Gender: Male Account #: [id number] Procedure:                Colonoscopy Indications:              High risk colon cancer surveillance: Personal                            history of adenoma less than 10 mm in size, Last                            colonoscopy: December 2019 Medicines:                Monitored Anesthesia Care Procedure:                Pre-Anesthesia Assessment:                           - Prior to the procedure, a History and Physical                            was performed, and patient medications and                            allergies were reviewed. The patient's tolerance of                            previous anesthesia was also reviewed. The risks                            and benefits of the procedure and the sedation                            options and risks were discussed with the patient.                            All questions were answered, and informed consent                            was obtained. Prior Anticoagulants: The patient has                            taken no anticoagulant or antiplatelet agents. ASA                            Grade Assessment: II - A patient with mild systemic                            disease. After reviewing the risks and benefits,                            the patient was deemed in satisfactory condition to  undergo the procedure.                           After obtaining informed consent, the colonoscope                            was passed under direct vision. Throughout the                            procedure, the patient's blood pressure, pulse, and                            oxygen saturations were monitored continuously. The                            CF [id number] #[id number] was  introduced through the anus                            and advanced to the the terminal ileum, with                            identification of the appendiceal orifice and IC                            valve. The colonoscopy was performed without                            difficulty. The patient tolerated the procedure                            well. The quality of the bowel preparation was                            excellent. The terminal ileum, ileocecal valve,                            appendiceal orifice, and rectum were photographed.                            The bowel preparation used was SUPREP via split                            dose instruction. Scope In: 1:36:16 PM Scope Out: 1:46:46 PM Scope Withdrawal Time: 0 hours 8 minutes 57 seconds  Total Procedure Duration: 0 hours 10 minutes 30 seconds  Findings:                 The perianal and digital rectal examinations were                            normal. Pertinent negatives include normal                            sphincter tone and no palpable rectal lesions.  A 1 mm polyp was found in the cecum. The polyp was                            sessile. The polyp was removed with a cold snare.                            Resection and retrieval were complete. Estimated                            blood loss was minimal.                           A 4 mm polyp was found in the transverse colon. The                            polyp was sessile. The polyp was removed with a                            cold snare. Resection and retrieval were complete.                            Estimated blood loss was minimal.                           A few small-mouthed diverticula were found in the                            sigmoid colon.                           The exam was otherwise normal throughout the                            examined colon.                           The terminal ileum appeared normal.                            Non-bleeding internal hemorrhoids were found during                            retroflexion. The hemorrhoids were Grade I                            (internal hemorrhoids that do not prolapse).                           No additional abnormalities were found on                            retroflexion. Complications:            No immediate complications. Estimated Blood Loss:     Estimated blood loss was minimal. Impression:               -  One 1 mm polyp in the cecum, removed with a cold                            snare. Resected and retrieved.                           - One 4 mm polyp in the transverse colon, removed                            with a cold snare. Resected and retrieved.                           - Mild diverticulosis in the sigmoid colon.                           - The examined portion of the ileum was normal.                           - Non-bleeding internal hemorrhoids. Recommendation:           - Patient has a contact number available for                            emergencies. The signs and symptoms of potential                            delayed complications were discussed with the                            patient. Return to normal activities tomorrow.                            Written discharge instructions were provided to the                            patient.                           - Resume previous diet.                           - Continue present medications.                           - Await pathology results.                           - Repeat colonoscopy (date not yet determined) for                            surveillance based on pathology results. Hadlie Gipson E. Stacia, MD 03/06/2024 1:53:08 PM This report has been signed electronically.

## 2024-03-06 NOTE — Patient Instructions (Signed)
-   Resume previous diet. - Continue present medications. - Await pathology results. - Repeat colonoscopy for surveillance based on  pathology results.  YOU HAD AN ENDOSCOPIC PROCEDURE TODAY AT THE [facility]:   Refer to the procedure report that was given to you for any specific questions about what was found during the examination.  If the procedure report does not answer your questions, please call your gastroenterologist to clarify.  If you requested that your care partner not be given the details of your procedure findings, then the procedure report has been included in a sealed envelope for you to review at your convenience later.  YOU SHOULD EXPECT: Some feelings of bloating in the abdomen. Passage of more gas than usual.  Walking can help get rid of the air that was put into your GI tract during the procedure and reduce the bloating. If you had a lower endoscopy (such as a colonoscopy or flexible sigmoidoscopy) you may notice spotting of blood in your stool or on the toilet paper. If you underwent a bowel prep for your procedure, you may not have a normal bowel movement for a few days.  Please Note:  You might notice some irritation and congestion in your nose or some drainage.  This is from the oxygen used during your procedure.  There is no need for concern and it should clear up in a day or so.  SYMPTOMS TO REPORT IMMEDIATELY:  Following lower endoscopy (colonoscopy or flexible sigmoidoscopy):  Excessive amounts of blood in the stool  Significant tenderness or worsening of abdominal pains  Swelling of the abdomen that is new, acute  Fever of 100F or higher   For urgent or emergent issues, a gastroenterologist can be reached at any hour by calling [phone]. Do not use MyChart messaging for urgent concerns.    DIET:  We do recommend a small meal at first, but then you may proceed to your regular diet.  Drink plenty of fluids but you should avoid alcoholic  beverages for 24 hours.  ACTIVITY:  You should plan to take it easy for the rest of today and you should NOT DRIVE or use heavy machinery until tomorrow (because of the sedation medicines used during the test).    FOLLOW UP: Our staff will call the number listed on your records the next business day following your procedure.  We will call around 7:15- 8:00 am to check on you and address any questions or concerns that you may have regarding the information given to you following your procedure. If we do not reach you, we will leave a message.     If any biopsies were taken you will be contacted by phone or by letter within the next 1-3 weeks.  Please call us at [phone] if you have not heard about the biopsies in 3 weeks.    SIGNATURES/CONFIDENTIALITY: You and/or your care partner have signed paperwork which will be entered into your electronic medical record.  These signatures attest to the fact that that the information above on your After Visit Summary has been reviewed and is understood.  Full responsibility of the confidentiality of this discharge information lies with you and/or your care-partner. 

## 2024-03-06 NOTE — Progress Notes (Signed)
 Transferred to PACU via stretcher.  Not responding to stimulation at this time.  VSS upon leaving procedure room.

## 2024-03-06 NOTE — Progress Notes (Signed)
 Pt's states no medical or surgical changes since previsit or office visit.

## 2024-03-06 NOTE — Progress Notes (Signed)
 Called to room to assist during endoscopic procedure.  Patient ID and intended procedure confirmed with present staff. Received instructions for my participation in the procedure from the performing physician.

## 2024-03-06 NOTE — Progress Notes (Signed)
 [facility] History and Physical   Primary Care Physician:  de [place], Quintin PARAS, MD   Reason for Procedure:   Colon cancer screening/history of polyps  Plan:    Colonoscopy    HPI: Drew Estrada is a 59 y.o. male undergoing surveillance colonoscopy.  He has no family history of colon cancer and no chronic GI symptoms.  He had a colonoscopy in Dec 2019 by Dr. Teressa in which 2 small polyps were removed in the rectum and cecum, both tubular adenomas.   The patient was provided an opportunity to ask questions and all were answered. The patient agreed with the plan    Past Medical History:  Diagnosis Date   ED (erectile dysfunction)    GERD (gastroesophageal reflux disease)    H/O colonoscopy 2007    for blood in stools: neg except for int. hemorrhoids     Past Surgical History:  Procedure Laterality Date   BUNIONECTOMY Right ~2011   has a screw   COLONOSCOPY     INCISION AND DRAINAGE PERIRECTAL ABSCESS  12/2007   KNEE ARTHROPLASTY Right 11/24/2023    Prior to Admission medications   Medication Sig Start Date End Date Taking? Authorizing Provider  acetaminophen (TYLENOL) 500 MG tablet Take 325 mg by mouth as needed.   Yes [provider]  meloxicam  (MOBIC ) 15 MG tablet TAKE 1 TABLET (15 MG TOTAL) BY MOUTH DAILY. 11/26/23  Yes de [place], Raymond J, MD  diclofenac  Sodium (VOLTAREN ) 1 % GEL Apply 4 g topically 4 (four) times daily. 03/12/20   Paz, Jose E, MD  ibuprofen (ADVIL) 200 MG tablet Take 200 mg by mouth every 6 (six) hours as needed for moderate pain.    [provider]  omeprazole (PRILOSEC OTC) 20 MG tablet Take 20 mg by mouth daily.    [provider]  semaglutide -weight management (WEGOVY ) 1 MG/0.5ML SOAJ SQ injection Inject 1 mg into the skin once a week. 02/07/24   de [place], Raymond J, MD  sildenafil  (VIAGRA ) 100 MG tablet TAKE 1/2 TO 1 TABLET BY MOUTH DAILY AS NEEDED FOR ERECTILE DYSFUNCTION 10/25/23   de [place], Raymond J, MD     Current Outpatient Medications  Medication Sig Dispense Refill   acetaminophen (TYLENOL) 500 MG tablet Take 325 mg by mouth as needed.     meloxicam  (MOBIC ) 15 MG tablet TAKE 1 TABLET (15 MG TOTAL) BY MOUTH DAILY. 30 tablet 1   diclofenac  Sodium (VOLTAREN ) 1 % GEL Apply 4 g topically 4 (four) times daily. 100 g 5   ibuprofen (ADVIL) 200 MG tablet Take 200 mg by mouth every 6 (six) hours as needed for moderate pain.     omeprazole (PRILOSEC OTC) 20 MG tablet Take 20 mg by mouth daily.     semaglutide -weight management (WEGOVY ) 1 MG/0.5ML SOAJ SQ injection Inject 1 mg into the skin once a week. 2 mL 1   sildenafil  (VIAGRA ) 100 MG tablet TAKE 1/2 TO 1 TABLET BY MOUTH DAILY AS NEEDED FOR ERECTILE DYSFUNCTION 10 tablet 3   Current Facility-Administered Medications  Medication Dose Route Frequency Provider Last Rate Last Admin   0.9 %  sodium chloride  infusion  500 mL Intravenous Once Stacia Glendia BRAVO, MD        Allergies as of 03/06/2024 - Review Complete 03/06/2024  Allergen Reaction Noted   Codeine sulfate Nausea Only 02/01/2007    Family History  Problem Relation Age of Onset   Diabetes Mother    Diabetes Brother  7 of 11 brothers have DM   Lung cancer Brother 90   Colon cancer Neg Hx    Prostate cancer Neg Hx    CAD Neg Hx    Stroke Neg Hx    Esophageal cancer Neg Hx    Stomach cancer Neg Hx    Rectal cancer Neg Hx    Colon polyps Neg Hx     Social History   Socioeconomic History   Marital status: Married    Spouse name: Not on file   Number of children: 1   Years of education: Not on file   Highest education level: 12th grade  Occupational History   Occupation: restaurant business   Tobacco Use   Smoking status: Light Smoker    Types: Cigars    Passive exposure: Current   Smokeless tobacco: Never   Tobacco comments:    Smokes cigars- very rare  Vaping Use   Vaping status: Never Used  Substance and Sexual Activity   Alcohol use: Yes     Alcohol/week: 7.0 standard drinks of alcohol    Types: 7 Glasses of wine per week    Comment: 1 glass of wine every day   Drug use: Not Currently    Types: Cocaine   Sexual activity: Not on file  Other Topics Concern   Not on file  Social History Narrative   Daughter passed 2023.   Social Drivers of Corporate Investment Banker Strain: Low Risk  (02/06/2024)   Overall Financial Resource Strain (CARDIA)    Difficulty of Paying Living Expenses: Not hard at all  Food Insecurity: Food Insecurity Present (02/06/2024)   Hunger Vital Sign    Worried About Running Out of Food in the Last Year: Sometimes true    Ran Out of Food in the Last Year: Never true  Transportation Needs: No Transportation Needs (02/06/2024)   PRAPARE - Administrator, Civil Service (Medical): No    Lack of Transportation (Non-Medical): No  Physical Activity: Insufficiently Active (02/06/2024)   Exercise Vital Sign    Days of Exercise per Week: 2 days    Minutes of Exercise per Session: 30 min  Stress: No Stress Concern Present (02/06/2024)   [employer] of Occupational Health - Occupational Stress Questionnaire    Feeling of Stress: Only a little  Social Connections: Moderately Integrated (02/06/2024)   Social Connection and Isolation Panel    Frequency of Communication with Friends and Family: Three times a week    Frequency of Social Gatherings with Friends and Family: Twice a week    Attends Religious Services: More than 4 times per year    Active Member of [employer] or Organizations: No    Attends Engineer, Structural: Not on file    Marital Status: Married  Catering Manager Violence: Not At Risk (06/15/2023)   Humiliation, Afraid, Rape, and Kick questionnaire    Fear of Current or Ex-Partner: No    Emotionally Abused: No    Physically Abused: No    Sexually Abused: No    Review of Systems:  All other review of systems negative except as mentioned in the HPI.  Physical  Exam: Vital signs BP 126/76   Pulse 85   Temp 98.4 F (36.9 C)   Ht 5' 5 (1.651 m)   Wt 188 lb (85.3 kg)   SpO2 97%   BMI 31.28 kg/m   General:   Alert,  Well-developed, well-nourished, pleasant and cooperative in NAD Airway:  Mallampati  1 Lungs:  Clear throughout to auscultation.   Heart:  Regular rate and rhythm; no murmurs, clicks, rubs,  or gallops. Abdomen:  Soft, nontender and nondistended. Normal bowel sounds.   Neuro/Psych:  Normal mood and affect. A and O x 3   Abreanna Drawdy E. Stacia, MD [facility]

## 2024-03-07 ENCOUNTER — Telehealth: Payer: Self-pay

## 2024-03-07 NOTE — Telephone Encounter (Signed)
  Follow up Call-     03/06/2024   12:57 PM  Call back number  Post procedure Call Back phone  # [phone]  Permission to leave phone message Yes     Patient questions:  Do you have a fever, pain , or abdominal swelling? No. Pain Score  0 *  Have you tolerated food without any problems? Yes.    Have you been able to return to your normal activities? Yes.    Do you have any questions about your discharge instructions: Diet   No. Medications  No. Follow up visit  No.  Do you have questions or concerns about your Care? No.  Actions: * If pain score is 4 or above: No action needed, pain <4.

## 2024-03-09 LAB — SURGICAL PATHOLOGY

## 2024-03-13 ENCOUNTER — Ambulatory Visit: Payer: Self-pay | Admitting: Gastroenterology

## 2024-03-13 NOTE — Progress Notes (Signed)
 Mr. Drew Estrada,  The two polyps which I removed during your recent procedure were proven to be completely benign but are considered pre-cancerous polyps that MAY have grown into cancer if they had not been removed.  Studies shows that at least 20% of women over age 56 and 30% of men over age 39 have pre-cancerous polyps.  Based on current nationally recognized surveillance guidelines, I recommend that you have a repeat colonoscopy in 7 years.   If you develop any new rectal bleeding, abdominal pain or significant bowel habit changes, please contact me before then.

## 2024-03-31 ENCOUNTER — Other Ambulatory Visit (HOSPITAL_BASED_OUTPATIENT_CLINIC_OR_DEPARTMENT_OTHER): Payer: Self-pay

## 2024-04-11 ENCOUNTER — Ambulatory Visit (HOSPITAL_BASED_OUTPATIENT_CLINIC_OR_DEPARTMENT_OTHER): Admitting: Family Medicine

## 2024-04-12 ENCOUNTER — Ambulatory Visit (HOSPITAL_BASED_OUTPATIENT_CLINIC_OR_DEPARTMENT_OTHER): Admitting: Family Medicine

## 2024-05-11 ENCOUNTER — Other Ambulatory Visit (HOSPITAL_BASED_OUTPATIENT_CLINIC_OR_DEPARTMENT_OTHER): Payer: Self-pay

## 2024-05-11 ENCOUNTER — Other Ambulatory Visit (HOSPITAL_BASED_OUTPATIENT_CLINIC_OR_DEPARTMENT_OTHER): Payer: Self-pay | Admitting: Family Medicine

## 2024-05-11 DIAGNOSIS — E66811 Obesity, class 1: Secondary | ICD-10-CM

## 2024-05-11 MED ORDER — WEGOVY 1 MG/0.5ML ~~LOC~~ SOAJ
1.0000 mg | SUBCUTANEOUS | 1 refills | Status: DC
  Filled 2024-05-11: qty 2, 28d supply, fill #0

## 2024-05-13 ENCOUNTER — Other Ambulatory Visit (HOSPITAL_BASED_OUTPATIENT_CLINIC_OR_DEPARTMENT_OTHER): Payer: Self-pay

## 2024-05-15 ENCOUNTER — Ambulatory Visit

## 2024-05-15 ENCOUNTER — Ambulatory Visit: Admitting: Podiatry

## 2024-05-15 ENCOUNTER — Encounter: Payer: Self-pay | Admitting: Podiatry

## 2024-05-15 DIAGNOSIS — T84223A Displacement of internal fixation device of bones of foot and toes, initial encounter: Secondary | ICD-10-CM

## 2024-05-15 DIAGNOSIS — M722 Plantar fascial fibromatosis: Secondary | ICD-10-CM | POA: Diagnosis not present

## 2024-05-16 ENCOUNTER — Ambulatory Visit (HOSPITAL_BASED_OUTPATIENT_CLINIC_OR_DEPARTMENT_OTHER): Admitting: Family Medicine

## 2024-05-16 NOTE — Progress Notes (Signed)
 Subjective:   Patient ID: Drew Estrada, male   DOB: 59 y.o.   MRN: [id number]   HPI Patient presents with a lot of recent pain on top of his left foot where we had done a bunion approximately 7 years ago.  States it has been aggravating and has been going on for several months and he is trying to wear wider shoes and padding neuro   ROS      Objective:  Physical Exam  Vascular status intact with inflammation of the dorsal left first metatarsal distal with excellent range of motion with history of biplanar osteotomy     Assessment:  Abnormal pin with that look like the pin may have moved and dorsal direction with irritation     Plan:  H&P reviewed I do think the pin should be removed and I explained procedure risk and patient wants this done.  I allowed him to go over consent form and after review he signed understanding all risk with removal of pin and is willing to accept this and will have this done in the next few weeks  X-rays indicate the pin is slightly prominent in the dorsal direction with the joint itself doing good from previous surgery

## 2024-05-18 ENCOUNTER — Telehealth: Payer: Self-pay | Admitting: Podiatry

## 2024-05-18 ENCOUNTER — Encounter: Payer: Self-pay | Admitting: Podiatry

## 2024-05-18 NOTE — Telephone Encounter (Signed)
 DOS- 05/19/2024  REMOVAL FIXATION DEEP KWIRE/SCREW LT- 20680  [employer] EFFECTIVE DATE- 08/25/2012  PER [employer]IG[employer] AUTOMATED SYSTEM, PRIOR AUTH IS NOT REQUIRED FOR CPT CODE 79319. REF# [id number]

## 2024-05-19 ENCOUNTER — Ambulatory Visit: Admitting: Podiatry

## 2024-05-19 ENCOUNTER — Encounter: Payer: Self-pay | Admitting: Podiatry

## 2024-05-19 VITALS — BP 118/84 | HR 78 | Temp 98.6°F | Resp 18 | Ht 65.0 in | Wt 188.0 lb

## 2024-05-19 DIAGNOSIS — T84223A Displacement of internal fixation device of bones of foot and toes, initial encounter: Secondary | ICD-10-CM | POA: Diagnosis not present

## 2024-05-19 MED ORDER — DOXYCYCLINE HYCLATE 100 MG PO TABS: 100.0000 mg | ORAL_TABLET | Freq: Two times a day (BID) | ORAL | 1 refills | Status: AC

## 2024-05-23 ENCOUNTER — Ambulatory Visit (HOSPITAL_BASED_OUTPATIENT_CLINIC_OR_DEPARTMENT_OTHER): Admitting: Family Medicine

## 2024-05-23 ENCOUNTER — Encounter (HOSPITAL_BASED_OUTPATIENT_CLINIC_OR_DEPARTMENT_OTHER): Payer: Self-pay | Admitting: Family Medicine

## 2024-05-23 ENCOUNTER — Other Ambulatory Visit (HOSPITAL_BASED_OUTPATIENT_CLINIC_OR_DEPARTMENT_OTHER): Payer: Self-pay

## 2024-05-23 VITALS — BP 121/84 | HR 84 | Temp 98.1°F | Resp 16 | Ht 65.0 in | Wt 193.1 lb

## 2024-05-23 DIAGNOSIS — E66811 Obesity, class 1: Secondary | ICD-10-CM | POA: Diagnosis not present

## 2024-05-23 MED ORDER — WEGOVY 4 MG PO TABS
4.0000 mg | ORAL_TABLET | Freq: Every day | ORAL | 1 refills | Status: AC
  Filled 2024-05-23: qty 30, 30d supply, fill #0

## 2024-05-23 NOTE — Progress Notes (Signed)
" ° ° °  Procedures performed today:    None.  Independent interpretation of notes and tests performed by another provider:   None.  Brief History, Exam, Impression, and Recommendations:    BP 121/84   Pulse 84   Temp 98.1 F (36.7 C) (Oral)   Resp 16   Ht 5' 5 (1.651 m)   Wt 193 lb 1.6 oz (87.6 kg)   SpO2 100%   BMI 32.13 kg/m   Obesity, Class I, BMI 30-34.9 Assessment & Plan: Patient continues with Wegovy  injection 1 mg.  Denies any issues with medication - did have mild side effects with starting 1 mg dose, however has been doing well more recently.  Has been tolerating well, no reported side effects.  He feels that he has lost weight since starting the medication. Exercise has been limited due to various MSK issues with knee and ankle/foot. But he does plan to progress with exercise as able to once allowed to from a MSK standpoint. He has been adjusting his diet, eating small meals, reducing carbohydrate intake. Weight prior to starting medication: 204 pounds Current weight: 193 pounds Continues to have some progress so far with weight loss.   He has some questions today about possibly switching from injection to oral tablet formulation.  We discussed considerations in this regard.  We can proceed with transition to oral formulation.  Discussed that there is not a direct recommendation for switching between pen and pill at his current dosage.  We can look to start with lower dose of pill and assess tolerability from there.  Prescription sent to pharmacy for 4 mg tablet.  He will finish out the supply of injection that he does have on hand, has 3 pens remaining.  Discussed starting tablet 1 week after last injection.  Cautioned on potential side effects.  Instructed on how to take this properly.  We will look to increase dosage of tablet on monthly basis as tolerated.  Will plan to follow-up in about 2 months.  Orders: -     Wegovy ; Take 1 tablet (4 mg total) by mouth daily. Daily in  morning on an empty stomach with 4 oz of water. Do not eat or drink for 30 minutes after dose.  Dispense: 30 tablet; Refill: 1  Return in about 2 months (around 07/21/2024) for med check.   ___________________________________________ Jaquelyn Sakamoto de [place], MD, ABFM, CAQSM Primary Care and Sports Medicine [facility] "

## 2024-05-23 NOTE — Assessment & Plan Note (Signed)
 Patient continues with Wegovy  injection 1 mg.  Denies any issues with medication - did have mild side effects with starting 1 mg dose, however has been doing well more recently.  Has been tolerating well, no reported side effects.  He feels that he has lost weight since starting the medication. Exercise has been limited due to various MSK issues with knee and ankle/foot. But he does plan to progress with exercise as able to once allowed to from a MSK standpoint. He has been adjusting his diet, eating small meals, reducing carbohydrate intake. Weight prior to starting medication: 204 pounds Current weight: 193 pounds Continues to have some progress so far with weight loss.   He has some questions today about possibly switching from injection to oral tablet formulation.  We discussed considerations in this regard.  We can proceed with transition to oral formulation.  Discussed that there is not a direct recommendation for switching between pen and pill at his current dosage.  We can look to start with lower dose of pill and assess tolerability from there.  Prescription sent to pharmacy for 4 mg tablet.  He will finish out the supply of injection that he does have on hand, has 3 pens remaining.  Discussed starting tablet 1 week after last injection.  Cautioned on potential side effects.  Instructed on how to take this properly.  We will look to increase dosage of tablet on monthly basis as tolerated.  Will plan to follow-up in about 2 months.

## 2024-05-23 NOTE — Progress Notes (Signed)
 Subjective:   Patient ID: Drew Estrada, male   DOB: 59 y.o.   MRN: [id number]   HPI Patient presents with a lot of pain on top of his left foot and states he is ready to get this pin removed its been inflamed more   ROS      Objective:  Physical Exam  Neurovascular status intact good digital perfusion noted it is more inflamed on the left I did not note any active drainage but it is quite inflamed where the pin has probably dislodged and is creating tissue irritation     Assessment:  Chronic tissue irritation left with abnormal pin position as part of the pathology     Plan:  H&P reviewed and at this time I anesthetized the left forefoot 60 mg like Marcaine mixture and patient was taken the operating room sterile prep done and using sterile instrumentation after tourniquet inflated to 250 mm of mercury I went ahead and I remove the pin and some thickened abnormal tissue.  I did identify the tendon and retracted it away from the area of surgery and took it down through capsule before pin removed.  I did flush this due to the fact it was inflamed as precautionary measure I placed patient on doxycycline .  I sutured with 4-0 nylon and sterile dressing applied to the left foot patient left the OR in satisfactory condition after tourniquet released with cap fill noted immediate to all digits

## 2024-06-02 ENCOUNTER — Ambulatory Visit

## 2024-06-02 DIAGNOSIS — T84223A Displacement of internal fixation device of bones of foot and toes, initial encounter: Secondary | ICD-10-CM

## 2024-06-27 ENCOUNTER — Encounter

## 2024-07-21 ENCOUNTER — Ambulatory Visit (HOSPITAL_BASED_OUTPATIENT_CLINIC_OR_DEPARTMENT_OTHER): Admitting: Family Medicine
